# Patient Record
Sex: Male | Born: 1935 | Race: Black or African American | Hispanic: No | Marital: Married | Smoking: Former smoker
Health system: Southern US, Community
[De-identification: ages and names within clinical notes are randomized; demographics above are authoritative.]

## PROBLEM LIST (undated history)

## (undated) DIAGNOSIS — I351 Nonrheumatic aortic (valve) insufficiency: Secondary | ICD-10-CM

## (undated) DIAGNOSIS — I341 Nonrheumatic mitral (valve) prolapse: Secondary | ICD-10-CM

## (undated) DIAGNOSIS — E559 Vitamin D deficiency, unspecified: Secondary | ICD-10-CM

## (undated) DIAGNOSIS — K5790 Diverticulosis of intestine, part unspecified, without perforation or abscess without bleeding: Secondary | ICD-10-CM

## (undated) DIAGNOSIS — K2270 Barrett's esophagus without dysplasia: Secondary | ICD-10-CM

## (undated) DIAGNOSIS — R809 Proteinuria, unspecified: Secondary | ICD-10-CM

## (undated) DIAGNOSIS — I519 Heart disease, unspecified: Secondary | ICD-10-CM

## (undated) DIAGNOSIS — D369 Benign neoplasm, unspecified site: Secondary | ICD-10-CM

## (undated) DIAGNOSIS — E119 Type 2 diabetes mellitus without complications: Secondary | ICD-10-CM

## (undated) DIAGNOSIS — I10 Essential (primary) hypertension: Secondary | ICD-10-CM

## (undated) DIAGNOSIS — E785 Hyperlipidemia, unspecified: Secondary | ICD-10-CM

## (undated) DIAGNOSIS — K4090 Unilateral inguinal hernia, without obstruction or gangrene, not specified as recurrent: Secondary | ICD-10-CM

## (undated) DIAGNOSIS — I2510 Atherosclerotic heart disease of native coronary artery without angina pectoris: Secondary | ICD-10-CM

## (undated) DIAGNOSIS — I219 Acute myocardial infarction, unspecified: Secondary | ICD-10-CM

## (undated) DIAGNOSIS — M1990 Unspecified osteoarthritis, unspecified site: Secondary | ICD-10-CM

## (undated) DIAGNOSIS — I340 Nonrheumatic mitral (valve) insufficiency: Secondary | ICD-10-CM

## (undated) DIAGNOSIS — G4700 Insomnia, unspecified: Secondary | ICD-10-CM

## (undated) HISTORY — DX: Insomnia, unspecified: G47.00

## (undated) HISTORY — DX: Nonrheumatic mitral (valve) prolapse: I34.1

## (undated) HISTORY — DX: Essential (primary) hypertension: I10

## (undated) HISTORY — PX: HERNIA REPAIR: SHX51

## (undated) HISTORY — DX: Vitamin D deficiency, unspecified: E55.9

## (undated) HISTORY — DX: Proteinuria, unspecified: R80.9

## (undated) HISTORY — PX: CATARACT EXTRACTION: SUR2

## (undated) HISTORY — DX: Barrett's esophagus without dysplasia: K22.70

## (undated) HISTORY — PX: TONSILLECTOMY: SUR1361

## (undated) HISTORY — DX: Unspecified osteoarthritis, unspecified site: M19.90

## (undated) HISTORY — DX: Acute myocardial infarction, unspecified: I21.9

## (undated) HISTORY — DX: Heart disease, unspecified: I51.9

## (undated) HISTORY — PX: TIBIA FRACTURE SURGERY: SHX806

## (undated) HISTORY — DX: Diverticulosis of intestine, part unspecified, without perforation or abscess without bleeding: K57.90

## (undated) HISTORY — DX: Hyperlipidemia, unspecified: E78.5

## (undated) HISTORY — DX: Nonrheumatic aortic (valve) insufficiency: I35.1

## (undated) HISTORY — DX: Benign neoplasm, unspecified site: D36.9

## (undated) HISTORY — DX: Unilateral inguinal hernia, without obstruction or gangrene, not specified as recurrent: K40.90

## (undated) HISTORY — DX: Nonrheumatic mitral (valve) insufficiency: I34.0

## (undated) HISTORY — DX: Type 2 diabetes mellitus without complications: E11.9

## (undated) HISTORY — PX: APPENDECTOMY: SHX54

## (undated) HISTORY — PX: TOTAL KNEE ARTHROPLASTY: SHX125

## (undated) HISTORY — PX: EYE SURGERY: SHX253

## (undated) HISTORY — DX: Atherosclerotic heart disease of native coronary artery without angina pectoris: I25.10

---

## 1998-10-12 ENCOUNTER — Encounter: Payer: Self-pay | Admitting: Urology

## 1998-10-14 ENCOUNTER — Observation Stay (HOSPITAL_COMMUNITY): Admission: RE | Admit: 1998-10-14 | Discharge: 1998-10-15 | Payer: Self-pay | Admitting: Urology

## 2000-10-10 HISTORY — PX: PENILE PROSTHESIS IMPLANT: SHX240

## 2001-01-09 DIAGNOSIS — I219 Acute myocardial infarction, unspecified: Secondary | ICD-10-CM

## 2001-01-09 HISTORY — PX: CORONARY ARTERY BYPASS GRAFT: SHX141

## 2001-01-09 HISTORY — DX: Acute myocardial infarction, unspecified: I21.9

## 2001-12-25 ENCOUNTER — Encounter: Payer: Self-pay | Admitting: Emergency Medicine

## 2001-12-26 ENCOUNTER — Inpatient Hospital Stay (HOSPITAL_COMMUNITY): Admission: AD | Admit: 2001-12-26 | Discharge: 2001-12-30 | Payer: Self-pay | Admitting: Cardiology

## 2001-12-26 ENCOUNTER — Encounter: Payer: Self-pay | Admitting: Surgery

## 2001-12-27 ENCOUNTER — Encounter: Payer: Self-pay | Admitting: Surgery

## 2001-12-28 ENCOUNTER — Encounter: Payer: Self-pay | Admitting: Surgery

## 2002-01-21 ENCOUNTER — Encounter: Admission: RE | Admit: 2002-01-21 | Discharge: 2002-01-21 | Payer: Self-pay | Admitting: Surgery

## 2002-01-21 ENCOUNTER — Encounter: Payer: Self-pay | Admitting: Surgery

## 2002-01-27 ENCOUNTER — Encounter (HOSPITAL_COMMUNITY): Admission: RE | Admit: 2002-01-27 | Discharge: 2002-04-27 | Payer: Self-pay | Admitting: Cardiology

## 2002-02-18 ENCOUNTER — Encounter: Admission: RE | Admit: 2002-02-18 | Discharge: 2002-05-19 | Payer: Self-pay | Admitting: Surgery

## 2003-01-10 HISTORY — PX: CARDIAC SURGERY: SHX584

## 2003-03-02 ENCOUNTER — Ambulatory Visit (HOSPITAL_COMMUNITY): Admission: RE | Admit: 2003-03-02 | Discharge: 2003-03-02 | Payer: Self-pay | Admitting: Internal Medicine

## 2003-03-26 ENCOUNTER — Ambulatory Visit (HOSPITAL_COMMUNITY): Admission: RE | Admit: 2003-03-26 | Discharge: 2003-03-26 | Payer: Self-pay | Admitting: *Deleted

## 2003-05-28 ENCOUNTER — Ambulatory Visit (HOSPITAL_COMMUNITY): Admission: RE | Admit: 2003-05-28 | Discharge: 2003-05-28 | Payer: Self-pay | Admitting: *Deleted

## 2003-07-02 ENCOUNTER — Inpatient Hospital Stay (HOSPITAL_COMMUNITY): Admission: RE | Admit: 2003-07-02 | Discharge: 2003-07-08 | Payer: Self-pay | Admitting: Orthopedic Surgery

## 2003-07-22 ENCOUNTER — Emergency Department (HOSPITAL_COMMUNITY): Admission: EM | Admit: 2003-07-22 | Discharge: 2003-07-22 | Payer: Self-pay | Admitting: Family Medicine

## 2003-07-28 ENCOUNTER — Inpatient Hospital Stay (HOSPITAL_COMMUNITY): Admission: EM | Admit: 2003-07-28 | Discharge: 2003-07-31 | Payer: Self-pay | Admitting: Emergency Medicine

## 2005-08-24 ENCOUNTER — Encounter: Admission: RE | Admit: 2005-08-24 | Discharge: 2005-08-24 | Payer: Self-pay | Admitting: Internal Medicine

## 2005-09-13 ENCOUNTER — Encounter (INDEPENDENT_AMBULATORY_CARE_PROVIDER_SITE_OTHER): Payer: Self-pay | Admitting: Specialist

## 2005-09-13 ENCOUNTER — Ambulatory Visit (HOSPITAL_COMMUNITY): Admission: RE | Admit: 2005-09-13 | Discharge: 2005-09-13 | Payer: Self-pay | Admitting: *Deleted

## 2005-09-13 HISTORY — PX: ESOPHAGOGASTRODUODENOSCOPY: SHX1529

## 2005-11-04 ENCOUNTER — Emergency Department (HOSPITAL_COMMUNITY): Admission: EM | Admit: 2005-11-04 | Discharge: 2005-11-05 | Payer: Self-pay | Admitting: *Deleted

## 2007-05-28 ENCOUNTER — Encounter: Admission: RE | Admit: 2007-05-28 | Discharge: 2007-05-28 | Payer: Self-pay | Admitting: Internal Medicine

## 2007-06-04 ENCOUNTER — Ambulatory Visit (HOSPITAL_COMMUNITY): Admission: RE | Admit: 2007-06-04 | Discharge: 2007-06-04 | Payer: Self-pay | Admitting: *Deleted

## 2007-06-04 ENCOUNTER — Encounter (INDEPENDENT_AMBULATORY_CARE_PROVIDER_SITE_OTHER): Payer: Self-pay | Admitting: *Deleted

## 2007-07-31 ENCOUNTER — Encounter (INDEPENDENT_AMBULATORY_CARE_PROVIDER_SITE_OTHER): Payer: Self-pay | Admitting: *Deleted

## 2007-07-31 ENCOUNTER — Ambulatory Visit (HOSPITAL_COMMUNITY): Admission: RE | Admit: 2007-07-31 | Discharge: 2007-07-31 | Payer: Self-pay | Admitting: *Deleted

## 2008-08-06 ENCOUNTER — Ambulatory Visit (HOSPITAL_COMMUNITY): Admission: RE | Admit: 2008-08-06 | Discharge: 2008-08-06 | Payer: Self-pay | Admitting: *Deleted

## 2008-08-06 ENCOUNTER — Encounter (INDEPENDENT_AMBULATORY_CARE_PROVIDER_SITE_OTHER): Payer: Self-pay | Admitting: *Deleted

## 2009-01-09 DIAGNOSIS — D369 Benign neoplasm, unspecified site: Secondary | ICD-10-CM

## 2009-01-09 HISTORY — DX: Benign neoplasm, unspecified site: D36.9

## 2009-02-10 DIAGNOSIS — I340 Nonrheumatic mitral (valve) insufficiency: Secondary | ICD-10-CM

## 2009-02-10 HISTORY — DX: Nonrheumatic mitral (valve) insufficiency: I34.0

## 2009-06-28 ENCOUNTER — Ambulatory Visit: Admission: RE | Admit: 2009-06-28 | Discharge: 2009-06-28 | Payer: Self-pay | Admitting: Cardiovascular Disease

## 2009-09-29 ENCOUNTER — Encounter: Admission: RE | Admit: 2009-09-29 | Discharge: 2009-09-29 | Payer: Self-pay | Admitting: Nephrology

## 2010-01-22 ENCOUNTER — Emergency Department (HOSPITAL_COMMUNITY)
Admission: EM | Admit: 2010-01-22 | Discharge: 2010-01-22 | Payer: Self-pay | Source: Home / Self Care | Admitting: Emergency Medicine

## 2010-01-24 LAB — COMPREHENSIVE METABOLIC PANEL
ALT: 26 U/L (ref 0–53)
AST: 91 U/L — ABNORMAL HIGH (ref 0–37)
Albumin: 3.5 g/dL (ref 3.5–5.2)
Alkaline Phosphatase: 82 U/L (ref 39–117)
BUN: 14 mg/dL (ref 6–23)
CO2: 24 mEq/L (ref 19–32)
Calcium: 8.9 mg/dL (ref 8.4–10.5)
Chloride: 101 mEq/L (ref 96–112)
Creatinine, Ser: 1.09 mg/dL (ref 0.4–1.5)
GFR calc Af Amer: >60 mL/min (ref >60)
GFR calc non Af Amer: >60 mL/min (ref >60)
Glucose, Bld: 153 mg/dL — ABNORMAL HIGH (ref 70–99)
Potassium: 3.9 mEq/L (ref 3.5–5.1)
Sodium: 135 mEq/L (ref 135–145)
Total Bilirubin: 0.5 mg/dL (ref 0.3–1.2)
Total Protein: 6.8 g/dL (ref 6.0–8.3)

## 2010-01-24 LAB — DIFFERENTIAL
Basophils Absolute: 0 10*3/uL (ref 0.0–0.1)
Basophils Relative: 0 % (ref 0–1)
Eosinophils Absolute: 0.2 10*3/uL (ref 0.0–0.7)
Eosinophils Relative: 4 % (ref 0–5)
Lymphocytes Relative: 40 % (ref 12–46)
Lymphs Abs: 1.7 10*3/uL (ref 0.7–4.0)
Monocytes Absolute: 0.4 10*3/uL (ref 0.1–1.0)
Monocytes Relative: 9 % (ref 3–12)
Neutro Abs: 2 10*3/uL (ref 1.7–7.7)
Neutrophils Relative %: 47 % (ref 43–77)

## 2010-01-24 LAB — CBC
HCT: 38.7 % — ABNORMAL LOW (ref 39.0–52.0)
Hemoglobin: 12.6 g/dL — ABNORMAL LOW (ref 13.0–17.0)
MCH: 29.6 pg (ref 26.0–34.0)
MCHC: 32.6 g/dL (ref 30.0–36.0)
MCV: 90.8 fL (ref 78.0–100.0)
Platelets: 154 10*3/uL (ref 150–400)
RBC: 4.26 MIL/uL (ref 4.22–5.81)
RDW: 13.8 % (ref 11.5–15.5)
WBC: 4.3 10*3/uL (ref 4.0–10.5)

## 2010-01-24 LAB — LIPASE, BLOOD: Lipase: 27 U/L (ref 11–59)

## 2010-04-17 LAB — GLUCOSE, CAPILLARY
Glucose-Capillary: 94 mg/dL (ref 70–99)
Glucose-Capillary: 99 mg/dL (ref 70–99)

## 2010-05-24 NOTE — Op Note (Signed)
NAME:  Richard [name], Richard [name] NO.:  [id number]   MEDICAL RECORD NO.:  [id number]          PATIENT TYPE:  AMB   LOCATION:  ENDO                         FACILITY:  [facility]   PHYSICIAN:  Georgiana Spinner, M.D.    DATE OF BIRTH:  06-Mar-1931   DATE OF PROCEDURE:  06/04/2007  DATE OF DISCHARGE:                               OPERATIVE REPORT   PROCEDURE:  Upper endoscopy with Savary dilation and biopsy.   INDICATIONS:  Dysphagia.   ANESTHESIA:  Fentanyl 75 mcg and Versed 7.5 mg.   DESCRIPTION OF PROCEDURE:  With the patient mildly sedated in room 4 of  endoscopy at [facility], the Pentax videoscopic endoscope was  inserted in the mouth passed under direct vision rather easily into the  esophagus and distally which appeared normal until we reached the distal  esophagus and there appeared to be an esophageal stricture.  We were  able to pass through this with slight resistance into a hiatal hernia  which was photographed.  We advanced through the stomach, fundus, body,  antrum, duodenal bulb, second portion duodenum were visualized.  From  this point the endoscope was slowly withdrawn taking circumferential  views of duodenal mucosa until the endoscope had been pulled back in the  stomach, placed in retroflexion to view the stomach from below.  The  endoscope was then straightened and biopsies were taken of the  erythematous changes in the gastric antrum.  Once accomplished, a  guidewire was passed.  The endoscope was withdrawn.  Subsequently Savary  dilators 14 and 16 were passed rather easily over the guidewire with  fluoroscopic control.  There was no evidence of blood on either dilator,  but I elected to withdraw the guidewire with removal of the 16 Savary  dilator.  The endoscope was then reinserted and some small amount of  blood was suctioned from the hiatal hernia sac.  The endoscope was then  withdrawn.  The patient's vital signs and pulse oximeter remained  stable.  The patient tolerated the procedure well without apparent  complications.   FINDINGS:  1. Erythematous changes of antrum of stomach.  Biopsies taken to rule      out gastritis.  2. Question of Barrett's esophagus seen on retroflex view, visualized,      but not biopsied at this point.  3. Dilation of distal esophagus to 14 and 16 Savary.   PLAN:  Await clinical response and biopsy report.  The patient will call  me for results and follow-up with me as an outpatient.  We will make  sure the patient is started if not already on a PPI.           ______________________________  Georgiana Spinner, M.D.     GMO/MEDQ  D:  06/04/2007  T:  06/04/2007  Job:  [id number]

## 2010-05-24 NOTE — Op Note (Signed)
NAME:  Richard [name], Richard [name] NO.:  [id number]   MEDICAL RECORD NO.:  [id number]          PATIENT TYPE:  AMB   LOCATION:  ENDO                         FACILITY:  [facility]   PHYSICIAN:  Georgiana Spinner, M.D.    DATE OF BIRTH:  1931/05/23   DATE OF PROCEDURE:  08/06/2008  DATE OF DISCHARGE:                               OPERATIVE REPORT   PROCEDURE:  Upper endoscopy with biopsy.   INDICATIONS:  GERD.   ANESTHESIA:  Fentanyl 75 mcg, Versed 6 mg.   PROCEDURE:  With the patient mildly sedated in the left lateral  decubitus position, the Pentax videoscopic endoscope was inserted in the  mouth, passed under direct vision through the esophagus which appeared  normal, and we entered into the stomach through a hiatal hernia.  Fundus, body, antrum, duodenal bulb, and second portion of the duodenum  were visualized.  From this point, the endoscope was slowly withdrawn,  taking circumferential views of duodenal mucosa until the endoscope been  pulled back in the stomach and placed in retroflexion to view the  stomach from below, and on close view, there appeared to be an area of  Barrett's esophagus, photographed and biopsies taken.  The endoscope was  straightened and withdrawn, taking circumferential views of the  remaining gastric and esophageal mucosa.  The patient's vital signs and  pulse oximeter remained stable.  The patient tolerated the procedure  well without apparent complications.   FINDINGS:  What appears to be Barrett's esophagus seen on retroflexed  view.  Await biopsy report.  The patient will call me for results and  follow-up as needed as an outpatient.           ______________________________  Georgiana Spinner, M.D.     GMO/MEDQ  D:  08/06/2008  T:  08/06/2008  Job:  [id number]

## 2010-05-24 NOTE — Op Note (Signed)
NAME:  [name], Richard NO.:  [id number]   MEDICAL RECORD NO.:  [id number]          PATIENT TYPE:  AMB   LOCATION:  ENDO                         FACILITY:  [facility]   PHYSICIAN:  Georgiana Spinner, M.D.    DATE OF BIRTH:  09/04/1931   DATE OF PROCEDURE:  DATE OF DISCHARGE:                               OPERATIVE REPORT   PROCEDURE:  Upper endoscopy with biopsy.   INDICATIONS:  Question of Barrett's esophagus.   ANESTHESIA:  Fentanyl 100 mcg, Versed 10 mg.   PROCEDURE IN DETAIL:  With the patient mildly sedated in the left  lateral decubitus position, the Pentax videoscopic endoscope was  inserted in the mouth and passed under direct vision through the  esophagus, which appeared normal.  There was no evidence of Barrett's on  direct view.  At this point we entered into the stomach through a hiatal  hernia.  The fundus appeared to be somewhat erythematous.  Photographs  were taken and subsequent biopsies were taken as well.  The endoscope  was advanced through the stomach, into the duodenal bulb, second portion  duodenum, both of which appeared normal.  From this point, the endoscope  was slowly withdrawn, taking circumferential views of duodenal mucosa,  until the endoscope had been pulled back into stomach, placed in  retroflexion to view the stomach from below, and a hiatal hernia was  seen.  We entered into the hiatal hernia and viewed the GE junction from  below and once again on close inspection,  there appeared not to be any  evidence of Barrett's esophagus.  The endoscope was then straightened  and withdrawn, taking the circumferential views of the remaining gastric  and esophageal mucosa, biopsying the fundic area of erythema.  The  patient's vital signs, pulse oximeter remained stable.  The patient  tolerated procedure well, without apparent complication.   FINDINGS:  Erythematous stomach.  Await biopsy report.  Hiatal hernia,  otherwise an unremarkable exam  at this time.   PLAN:  The patient will call me for results and follow up with me as  needed as an outpatient           ______________________________  Georgiana Spinner, M.D.     GMO/MEDQ  D:  07/31/2007  T:  07/31/2007  Job:  [id number]

## 2010-05-27 NOTE — Discharge Summary (Signed)
NAME:  Richard, [name]                           ACCOUNT NO.:  [id number]   MEDICAL RECORD NO.:  [id number]                   PATIENT TYPE:  INP   LOCATION:  5020                                 FACILITY:  [facility]   PHYSICIAN:  Burnard Bunting, M.D.                 DATE OF BIRTH:  1931/11/19   DATE OF ADMISSION:  07/02/2003  DATE OF DISCHARGE:  07/08/2003                                 DISCHARGE SUMMARY   DISCHARGE DIAGNOSIS:  Left knee arthritis.   SECONDARY DIAGNOSES:  1. Coronary artery disease.  2. Diabetes.  3. Hypertension.  4. Increased cholesterol.   OPERATIONS/PROCEDURES:  Left total knee replacement, performed July 02, 2003.   HOSPITAL COURSE:  Richard [name] is a 77 year old patient with left knee  arthritis, status post tibial plating.  He underwent left total knee  arthroplasty on July 02, 2003.  He tolerated the procedure well without any  complications.  He was transferred to the recovery room in stable condition.  Motor and sensory function of the foot was intact at that time.  He was  started on Coumadin for DVT prophylaxis and CPM machine for motion.  PT was  started for weightbearing as tolerated.  The patient mobilized well with  physical therapy.  The incision was intact on post-op day #2.  He had an  otherwise uneventful recovery.  He was discharged home in good condition  with blood sugars well controlled on July 08, 2003.  Ultrasound was negative  for DVT, before discharge.   The patient will follow up with me in seven days for suture removal.   DISCHARGE MEDICATIONS:  Include Coumadin, Percocet plus preadmission  medications.                                                Burnard Bunting, M.D.    GSD/MEDQ  D:  08/04/2003  T:  08/04/2003  Job:  [id number]

## 2010-05-27 NOTE — Op Note (Signed)
NAME:  [name], Richard [name]                           ACCOUNT NO.:  [id number]   MEDICAL RECORD NO.:  [id number]                   PATIENT TYPE:  AMB   LOCATION:  ENDO                                 FACILITY:  [facility]   PHYSICIAN:  Georgiana Spinner, M.D.                 DATE OF BIRTH:  08-16-1931   DATE OF PROCEDURE:  03/26/2003  DATE OF DISCHARGE:  03/26/2003                                 OPERATIVE REPORT   PROCEDURE:  Upper endoscopy with Savary dilation.   INDICATIONS:  Dysphagia with stricture.   ANESTHESIA:  Demerol 100, Versed 7.5 mg.   DESCRIPTION OF PROCEDURE:  With patient mildly sedated in the left lateral  decubitus position, the Olympus videoscopic endoscope was inserted in the  mouth, passed under direct vision through the esophagus, which appeared  normal, except there was a question of Barrett's esophagus seen and  photographed.  We entered into the stomach.  Fundus, body, antrum, duodenal  bulb, second portion of duodenum all appeared normal.  From this point, the  endoscope was slowly withdrawn, taking circumferential views of the duodenal  mucosa until the endoscope pulled back into the stomach, placed in  retroflexion to view the stomach from below.  The endoscope was then  straightened, and a guidewire was passed.  The endoscope was withdrawn.  Subsequently, 15 and 18 Savary dilators were passed rather easily.  Over the  guidewire with the latter, there was a trace amount of blood.  The endoscope  was reinserted, and it was noted that there was a small amount of blood in  this area which precluded being to able to biopsy the tissue adequately.  Therefore, the endoscope was withdrawn.  The patient's vital signs and pulse  oximeter remained stable.  The patient tolerated the procedure well without  apparent complications.   FINDINGS:  Stricture, dilated to 15 and 18 Savary.  Await clinical response, and we will have the patient follow up with me as  an outpatient and  consider repeat examination in 1 year for Barrett's  evaluation.                                               Georgiana Spinner, M.D.    GMO/MEDQ  D:  03/26/2003  T:  03/28/2003  Job:  [id number]

## 2010-05-27 NOTE — H&P (Signed)
NAME:  Richard [name], Richard [name]                           ACCOUNT NO.:  [id number]   MEDICAL RECORD NO.:  [id number]                   PATIENT TYPE:  INP   LOCATION:  1823                                 FACILITY:  [facility]   PHYSICIAN:  Jonna L. Robb Matar, M.D.            DATE OF BIRTH:  07/18/1931   DATE OF ADMISSION:  07/28/2003  DATE OF DISCHARGE:                                HISTORY & PHYSICAL   PRIMARY CARE PHYSICIAN:  Dr. Dimas Alexandria.   CHIEF COMPLAINT:  Passed out.   HISTORY:  This 77 year old African-American male underwent left total knee  replacement on July 02, 2003.  Since then he has lost his appetite, things  do not taste right, he has been drinking diet ginger ale and water and  eating nothing but applesauce and mashed potatoes.  He has been very tired,  weak, does not feel like getting up.  His family was wondering if he might  be a little bit depressed, but he states it is just because he feels so  poorly.  He has lost 26 pounds since surgery and today he passed out in Dr.  Pincus Sanes office, so he was brought to the hospital.   PAST MEDICAL HISTORY:  1. 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  2. Hypertension.  3. Type 2 diabetes.  The patient has noticed that his sugars which were     usually better controlled, have been running anywhere from 130 to 200, he     has been thirstier than usual, he also noted since his surgery that he     has had some burning or stinging sensation with urination.   ALLERGIES:  NONE.   OPERATIONS:  1. Left total knee replacement.  2. CABG five vessel.   FAMILY HISTORY:  Hypertension, diabetes, CVA.   SOCIAL HISTORY:  Ex smoker, ex drinker, married, several children.  He  complains that he has had 12 people on his case telling him what to do.  He  is a [employer] doctor.   MEDICATIONS:  1. Coumadin 5 daily.  2. Glipizide 5 daily.  3. Oxycodone 5/325 which he has not been  taking since he has not been in     pan.  4. Robaxin 500 mg which he also has not been taking.  5. Temazepam 15 at h.s.  6. Niaspan 500 at h.s.  7. Metformin 500 b.i.d.  8. Coreg 12.5 b.i.d.  9. Lisinopril/HCTZ 20/12.5 daily.   REVIEW OF SYSTEMS:  The patient had some generalized weakness, but no fever,  no visual problems.  He says his mouth has felt very dry and his throat has  been a little sore.  No coughing, wheezing, shortness of breath.  No chest  pains or swelling of his legs.  He has not actually been nauseous or  throwing  up.  He has had no hematemesis or melena, no previous history of  ulcer disease or liver problems.  He denies any hematuria or renal calculi.  No prostate problems.  He denies thyroid problems.  No skin disease.  Some  mild arthritis, but he has not been taking any anti-inflammatories over the  counter.  No history of CVA.  He denies depression, but as noted his family  wonders about his lack of motivation to get out of bed.   PHYSICAL EXAMINATION:  VITAL SIGNS:  Blood pressure was 80 on palp on EMS  and is 89/53 now, pulse 66, respirations 19, 97% on O2 on room air, 98.4.  GENERAL:  Well-developed, older African-American male in no acute distress.  HEENT:  Conjunctivae and lids are normal.  Pupils are reactive to light.  EOMs are full.  He has normal hearing.  His mucosa is dry and his tongue has  a little bit of a white coating on it.  NECK:  No masses, thyromegaly or carotid bruits.  RESPIRATORY:  Effort is normal, the lungs are clear to A&P with no wheezing,  rales, rhonchi or dullness.  HEART:  Regular rate and rhythm, normal S1 and S2 without murmurs, rubs or  gallops.  There is no cyanosis, clubbing or edema.  Sternotomy scar.  ABDOMEN:  Nontender, normal bowel sounds, no hepatosplenomegaly or hernias.  GU:  Normal scrotum and penis.  Prostate is slightly enlarged, nontender.  No cervical or inguinal adenopathy.  EXTREMITIES:  Muscle strength is  5/5 in all four extremities.  He has fairly  good range of motion of the left knee, there is a bit of swelling, but it  looks like it has healed up well.  SKIN:  No skin lesions or nodules.  NEUROLOGIC:  Cranial nerves are intact.  DTRs are 2+ at the knees, there is  no loss of sensation in the feet.  The patient is alert and oriented times  three.  Normal memory, judgment and affect.   INITIAL LABORATORY DATA:  Cardiac enzymes are negative.  White count is 8.9,  hemoglobin 11.0 with normal indices.  INR 3.1.  Sodium is mildly low at 132,  but of note a BUN 93, creatinine 3.8, glucose 174.  LFTs and calcium are  normal.  EKG shows poor R wave progression suggestive of an old anterior  wall MI.   History was obtained both from the patient and his wife and daughter.   IMPRESSION:  1. Syncope secondary to dehydration.  The patient will be put on intravenous     fluids and be taken off hydrochlorothiazide.  This also may actually     represent an acute renal failure and I will ultrasound his kidneys and     get a longer term evaluation.  Some of this may just be coming from     poorly controlled diabetes.  2. Anorexia.  This could be coming from ulcer disease, depression,     dehydration, poorly controlled diabetes, vitamin deficiencies like zinc.     We will work up all of these issues.  3. Type 2 diabetes.  Pretty poor control at this point, certainly with a     creatinine of 3.8 he should not be on metformin. I would increase his     Glucotrol to XL 10 daily and put him on sliding scale.  4. Coronary artery disease and hypertension.  I will continue his Coreg, but     I am going to  hold his ACE inhibitor.  5. Hypercholesterolemia.  We will continue Niaspan.  6. Carotid artery stenosis by history from his old records.  There is no     complaint of neurologic deficits, I will just note this.  7. Anemia.  This is probably mild blood loss with his surgery a month ago,    but I will work  it up nonetheless.                                                Jonna L. Robb Matar, M.D.    Dorna Bloom  D:  07/28/2003  T:  07/28/2003  Job:  [id number]   cc:   Janae Bridgeman. Eloise Harman., M.D.  [address]  [place]  [place] [address]  Fax: [phone]

## 2010-05-27 NOTE — Op Note (Signed)
NAME:  Richard [name], Richard [name] NO.:  [id number]   MEDICAL RECORD NO.:  [id number]                   PATIENT TYPE:  INP   LOCATION:  2012                                 FACILITY:  [facility]   PHYSICIAN:  Evelene Croon, M.D.                  DATE OF BIRTH:  1931/03/24   DATE OF PROCEDURE:  12/26/2001  DATE OF DISCHARGE:                                 OPERATIVE REPORT   PREOPERATIVE DIAGNOSIS:  Severe three-vessel coronary artery disease, status  post acute myocardial infarction.   POSTOPERATIVE DIAGNOSIS:  Severe three-vessel coronary artery disease,  status post acute myocardial infarction.   PROCEDURES:  1. Median sternotomy.  2. Extracorporeal circulation.  3. Coronary artery bypass graft surgery x5 using a left internal mammary     artery graft to the left anterior descending coronary artery, with a     saphenous vein graft to the obtuse marginal branch of the left circumflex     coronary artery, a sequential saphenous vein graft to two branches of the     intermediate coronary artery, and a saphenous vein graft to the right     coronary artery.  4. Endoscopic vein harvesting from the right leg.   SURGEON:  Evelene Croon, M.D.   ASSISTANT:  Loura Pardon, P.A.-C.   ANESTHESIA:  General endotracheal.   CLINICAL HISTORY:  This patient is a 77 year old black male with a history  of diabetes, smoking, and obesity, who was admitted with unstable angina and  a non-Q-wave myocardial infarction.  A cardiac catheterization showed severe  three-vessel coronary artery disease with a 90% LAD stenosis.  There was a  99% hazy proximal intermediate stenosis involving the bifurcation into two  large branches.  The right coronary artery was a small, nondominant vessel  that had about 80% midvessel stenosis.  The left circumflex was a dominant  vessel that gave off three small branches.  Between the first and second  marginal branches there was about 80% stenosis.  Left  ventricular function  was well-reserved.  After review of the angiogram and examination of the  patient, it was felt that coronary artery bypass graft surgery was the best  treatment.  I discussed the operative procedure with the patient and his  wife and daughter, including alternatives, benefits, and risks, including  bleeding, blood transfusion, infection, stroke, myocardial infarction, and  death.  They understood and agreed to proceed.   DESCRIPTION OF PROCEDURE:  The patient was taken to the operating room and  placed on the table in the supine position.  After induction of general  endotracheal anesthesia, a Foley catheter was placed in the bladder using  sterile technique.  Then the chest, abdomen, and both lower extremities were  prepped and draped in the usual sterile manner.  The chest was entered  through a median sternotomy incision and the pericardium opened in the  midline.  Examination of the heart showed good ventricular contractility.  The ascending aorta had no palpable plaques in it.   Then the left internal mammary artery was harvested from the chest wall as a  pedicle graft.  This was a medium-caliber vessel with excellent blood flow  through it.  At the same time a segment of greater saphenous vein was  harvested from the right leg using endoscopic vein harvest technique.  This  vein was of medium size and good quality.   Then the patient was heparinized and when an adequate activated clotting  time was achieved, the distal ascending aorta was cannulated using a 20  French aortic cannula for arterial inflow.  Venous outflow was achieved  using a two-stage venous cannula through the right atrial appendage.  An  antegrade cardioplegia and vent cannula was inserted in the aortic root.   The patient was placed on cardiopulmonary bypass and the distal coronary  arteries identified.  The LAD was a large, graftable vessel that was  diffusely diseased.  There were  several small diagonal branches that were  nongraftable vessels.  The intermediate gave off two large branches, both of  which were intramyocardial.  These were localized in the midportion, where  they appeared relatively free of disease.  The first obtuse marginal was a  tiny vessel.  The second marginal was also very small but felt to be  borderline graftable.  The distal left circumflex terminated as a small  posterior descending branch that was to small to graft.  The right coronary  artery was a nondominant vessel that crossed over the acute margin of the  heart and was suitable for grafting.   Then the aorta was crossclamped and 500 cc of cold blood antegrade  cardioplegia was administered in the aortic root with quick arrest of the  heart.  Systemic hypothermia to 20 degrees Centigrade and topical  hypothermia with iced saline was used.  A temperature probe was placed in  the septum and an insulating pad in the pericardium.   The first distal anastomosis was performed to the second obtuse marginal  branch.  The internal diameter of this vessel was about 1.25 mm.  The  conduit used was a segment of greater saphenous vein and the anastomosis  performed in an end-to-side manner using continuous 8-0 Prolene suture.  Flow was measured through the graft and was good.  Then another dose of  cardioplegia was given down this vein graft.   The second distal anastomosis was preformed to the first branch of the  intermediate coronary artery.  The internal diameter was about 2.5 mm.  The  conduit used was a second segment of greater saphenous vein and the  anastomosis performed in a sequential Steri-Strips manner using continuous 7-  0 Prolene suture.  Flow was measured through the graft and was excellent.   The third distal anastomosis was performed to the second branch of the  intermediate coronary artery.  The internal diameter was also about 2.5 mm. The conduit used was the same segment  of greater saphenous vein and the  anastomosis performed in a sequential end-to-side manner using continuous 7-  0 Prolene suture.  Flow was measured through the graft and was excellent.  Then another dose of cardioplegia was given down this vein graft and in the  aortic root.   The fourth distal anastomosis was performed to the right coronary artery.  The internal diameter of this vessel was about 1.75 mm.  The conduit used  was a third segment of greater saphenous vein and the anastomosis performed  in an end-to-side manner using continuous 7-0 Prolene suture.  Flow was  measured through the graft and was excellent.   The fifth distal anastomosis was performed to the mid- to distal portion of  the left anterior descending coronary artery.  The internal diameter was  about 2 mm.  The conduit used was the left internal mammary graft, and this  was brought through an opening in the left pericardium anterior to the  phrenic nerve.  It was anastomosed to the LAD in an end-to-side manner using  continuous 8-0 Prolene suture.  The pedicle was tacked to the epicardium  with 6-0 Prolene sutures.  The patient was rewarmed to 37 degrees Centigrade  and the clamp removed from the mammary pedicle.  There was rapid warming of  the ventricular septum and the return of spontaneous ventricular  fibrillation.  The crossclamp was removed with a time of 85 minutes, and the  patient defibrillated into sinus rhythm.   A partial occlusion clamp was placed on the aortic root and the three  proximal vein graft anastomoses were performed in an end-to-side manner  using continuous 6-0 Prolene suture.  The clamp was removed, the vein grafts  de-aired, and the clamps removed from them.  The proximal and distal  anastomoses appeared hemostatic and the alignment of the grafts  satisfactory.  Graft markers were placed around the proximal anastomoses.  Two temporary right ventricular and right atrial pacing wires  were placed  and brought out through the skin.   When the patient had rewarmed to 37 degrees Centigrade, he was weaned from  cardiopulmonary bypass on no inotropic agents.  Total bypass time was 124  minutes.  Cardiac function appeared excellent with a cardiac output of 5  L/min.  Protamine was given, and the venous and aortic cannulas were removed  without difficulty.  Hemostasis was achieved.  Three chest tubes were placed  with two in the posterior pericardium, one in the left pleural space and one  in the anterior mediastinum.  The pericardium was reapproximated over the  heart.  The sternum was closed with #6 stainless steel wires.  The fascia  was closed with continuous 2-0 Vicryl and the skin with 3-0 Vicryl  subcuticular closure.  The lower extremity vein harvest site was closed in  layers in a similar manner.  The sponge, needle, and instrument counts were  correct according to the scrub nurse.  Dry sterile dressings were applied over the incisions and around the chest tubes, which were hooked to  Pleuravac suction.  The patient remained hemodynamically stable and was  transported to the SICU in guarded but stable condition.                                               Evelene Croon, M.D.    BB/MEDQ  D:  12/26/2001  T:  12/28/2001  Job:  [id number]   cc:   Cristy Hilts. Jacinto Halim, M.D.  [address]  [place]  [place] [address]  Fax: [phone]   [facility]

## 2010-05-27 NOTE — Discharge Summary (Signed)
NAME:  BRANDAN, ROBICHEAUX                           ACCOUNT NO.:  [id number]   MEDICAL RECORD NO.:  [id number]                   PATIENT TYPE:  INP   LOCATION:  5153                                 FACILITY:  [facility]   PHYSICIAN:  Elliot Cousin, M.D.                 DATE OF BIRTH:  07/06/1931   DATE OF ADMISSION:  07/28/2003  DATE OF DISCHARGE:  07/31/2003                                 DISCHARGE SUMMARY   __________.   DISCHARGE DIAGNOSES:  1. Syncope versus presyncope felt to be secondary to volume depletion/acute     renal insufficiency.  2. Acute renal insufficiency.  3. E coli urinary tract infection.  4. Elevated prostate-specific antigen.  5. Status post left knee arthroplasty  6. Type was diabetes mellitus.  7. History of five-vessel coronary artery bypass grafting in the past,     status post acute myocardial infarction in December 2003.   DISCHARGE MEDICATIONS:  1. Do not take Coumadin.  2. Do not take lisinopril/hydrochlorothiazide until you see Dr. Lendell Caprice.  3. Cipro 250 mg b.i.d. for 10 days.  4. Glipizide 5 mg daily.  5. Metformin 500 mg one p.o. twice daily.  6. Temazepam 15 mg at night p.r.n.  7. Coreg 12.5 mg b.i.d.  8. Niaspan 500 mg at night.   DISPOSITION:  The patient was discharged to home in improved and stable  condition on July 31, 2003.  He has a followup appointment with his primary  care physician, Dr. Lendell Caprice, August 10, 2003 at 11:15 a.m.   CONSULTATIONS:  Dr. Lajoyce Corners.   PROCEDURES PERFORMED:  Renal ultrasound on July 29, 2003.  The results  revealed negative for hydronephrosis.  Right kidney measures 11.8 cm and the  left measures 11.9 cm.   HISTORY OF PRESENT ILLNESS:  The patient is a 77 year old African American  man with past medical history significant type 2 diabetes mellitus,  hypertension, and total left knee replacement on July 02, 2003 who presented  to Dr. Pincus Sanes office on July 28, 2003.  The patient had a loss of  appetite, things  did not taste right, and he felt very tired and weak.  He  had lost approximately 26 pounds since has last knee replacement in July 02, 2003.  When the patient was in Dr. Pincus Sanes office on July 28, 2003, he  apparently passed out.  The patient was, therefore, brought to the hospital  for further evaluation and management.   HOSPITAL COURSE:  Problem #1.  Acute renal failure, E coli urinary tract  infection, volume depletion.  The patient's chemistry panel on admission was  impressive for a BUN of 93 and a creatinine of 3.8.  His white blood cell  count was within normal limits at 8.9 and his INR was 3.1 for Coumadin  following the left knee replacement.  On review of the patient's lab data  from July 04, 2003,  his BUN was 14 and his creatinine was 1.3.  For further  evaluation of the patient's obvious acute renal failure, a urinalysis was  ordered as well as a renal ultrasound.  It was noted that the patient had  been treated in the outpatient setting with lisinopril/hydrochlorothiazide  for his history of hypertension.  Given the renal findings, the  lisinopril/hydrochlorothiazide were discontinued during the hospital course.  The patient was started on IV fluids with normal saline.  His renal function  was followed closely daily.  The patient's urinalysis revealed a large  amount of leukocyte esterase, many bacteria, and 21-50 wbc's.  The urine  culture eventually grew out E coli, greater than 100,000 colonies.  The  patient was started first on Rocephin 1 g IV daily empirically until the  culture results became apparent.  The Rocephin was eventually discontinued  and the patient was started on intravenous Cipro after the culture grew out  E coli which was sensitive to Cipro.  The patient's renal ultrasound  revealed no evidence of hydronephrosis.   The patient's BUN responded quite well to the intravenous fluids.  The  following day the patient's BUN fell to 61 and the creatinine  had improved  to 2.2.  On hospital day #3, the BUN improved to 33 and the creatinine  improved to 1.7.  The patient's generalized weakness had completely  resolved.  He felt stronger.  He began to ambulate in the room.  He had no  signs or symptoms consistent with a neurological cause for his syncope.  The  presyncope versus syncope was probably secondary to volume depletion and the  urinary tract infection.  This apparently all lead to the patient's acute  renal failure.  At the time of hospital discharge, the patient was advised  to not restart the hydrochlorothiazide and lisinopril until he was  reassessed by his primary care physician, Dr. Lendell Caprice in one week.  The  patient was advised to become liberal with his fluid intake over the next  seven days following hospital discharge.  He was also discharged to home on  Cipro 250 mg b.i.d. for an additional 10 days.  It was noted that the  patient's PSA was elevated at 7.82.  The elevation in the patient's PSA  could be attributable to the urinary tract infection.  However, the patient  may need an outpatient urology followup.  This decision will be deferred to  his primary care physician, Dr. Lendell Caprice.   Problem #2.  Total left knee arthroplasty on July 02, 2003 per Dr. Lajoyce Corners.  Dr. Lajoyce Corners was consulted for evaluation of the patient's left knee.  Per Dr.  Audrie Lia assessment, the patient's left knee incision was healing very well.  He recommended increasing the range of motion of the right knee per PT while  in the hospital and in the outpatient setting when the patient is at home.  Dr. Lajoyce Corners also recommended discontinuing the Coumadin which had been started  following the left knee operation in July 02, 2003.  The Coumadin was,  therefore, discontinued.   Problem #3.  Type 2 diabetes mellitus.  The patient's capillary blood sugars were well controlled during the hospital course.  The metformin was withheld  during the hospital course  secondary to acute renal insufficiency.  The  patient was maintained on glipizide 5 mg daily.  His hemoglobin A1C was 6.8  during the hospital course.   Problem #4.  Normocytic anemia.  The patient's hemoglobin during the  hospital course was 11 with hematocrit of 33 and MCV of 91.  Iron studies  revealed a total iron of 41, TIBC of 228, percent saturation of 18, vitamin  B12 __________ and serum folate of 9.  The patient was started on Nu-Iron at  150 mg daily.  He was discharged to home on a multivitamin with iron one  daily.   DISCHARGE LABORATORY DATA:  INR 1.8, sodium 141, potassium 4.2, chloride  114, CO2 22, glucose 101, BUN 33, creatinine 1.7, calcium 8.7, total  cholesterol 138, triglycerides 111, HDL 28, LDL 88, TSH 0.852, T4 6.8.                                                Elliot Cousin, M.D.    DF/MEDQ  D:  09/06/2003  T:  09/07/2003  Job:  [id number]

## 2010-05-27 NOTE — Cardiovascular Report (Signed)
NAME:  Richard [name], SCHLABACH NO.:  [id number]   MEDICAL RECORD NO.:  [id number]                   PATIENT TYPE:  OIB   LOCATION:  2930                                 FACILITY:  [facility]   PHYSICIAN:  Cristy Hilts. Jacinto Halim, M.D.                  DATE OF BIRTH:  Jan 22, 1931   DATE OF PROCEDURE:  12/25/2001  DATE OF DISCHARGE:                              CARDIAC CATHETERIZATION   PROCEDURE PERFORMED:  1. Left ventriculography.  2. Selective right and left coronary arteriography.  3. Intracoronary nitroglycerin administration.  4. Left subclavian arteriography including visualization of left internal     mammary artery.   INDICATIONS FOR PROCEDURE:  The patient is a 77 year old gentleman with a  history of hypertension, hypercholesterolemia, diabetes mellitus, who was  admitted through [facility] with chest pain suggestive of unstable  angina.  He also had ST segment elevation in the emergency room with chest  pain; hence, he was emergently transferred to [facility].  He was  found to have ST segment elevation in lead 1 and aVL with T wave inversion  in the inferior leads and anterior leads.   HEMODYNAMIC DATA:  1. The left ventricular pressures were 113/2 with an end diastolic pressure     of 22 mmHg.  2. The aortic pressures were 113/68 with a mean of 82 mmHg.  3. There was no pressure gradient across the aortic valve.   ANGIOGRAPHIC DATA:  1. Right coronary artery:  The right coronary artery is a nondominant     vessel.  It has a mid 80% stenosis.  There is a large conus branch that     has a separate ostia.  2. Left main coronary artery:  The left main coronary artery is a large     caliber vessel and a long vessel.  It is normal.  3. Circumflex coronary artery:  The circumflex artery is a large caliber     vessel.  It is a dominant vessel.  It gives origin to a high obtuse     marginal #1 which has a 99% AV thrombus lesion.  This obtuse  marginal #1     is a very large branch.  It supplies most of the lateral branch.  The     circumflex itself has in the mid to distal segment before the bifurcation     of obtuse marginal #2 and PDA has a 70-80% stenosis.  4. Left anterior descending artery:  The left anterior descending artery is     a severely diffuse vessel.  Right from the ostium up to the mid vessel,     it has an 85-90% long segmental stenosis.  In the mid to distal segment,     it has a 60% stenosis and the distal segment has about 80% stenosis.  It     wraps around the  apex.  5. Left ventricle:  The left ventricular systolic function was preserved     with an EF estimated in the lower limit of normal and was estimated at     50%.  There was a mid to distal anterolateral periapical and inferoapical     moderate hypokinesis.  There was a hypokinetic base.  6. Left subclavian artery:  The left subclavian artery and left internal     mammary artery were widely patent.   IMPRESSION:  1. Nondominant right coronary artery.  2. A dominant circumflex coronary artery with a hazy 99% stenosis with a     large obtuse marginal #1 which is a culprit lesion.  The circumflex also     has obtuse marginal #2, posterior descending artery bifurcation 70-80%     stenosis.  The circumflex is a dominant vessel.  3. Nonsegmental left anterior descending artery 85-90% stenosis in the     proximal and mid segment, and 60% in the mid to distal, and 80% stenosis     in the distal left anterior descending artery.  4. Left ventricular systolic function in the lower limit of normal and is     estimated at 50%.   RECOMMENDATIONS:  Based on his coronary anatomy as patient is presently  chest pain-free and his ECG is back to baseline with no evidence of  ischemia, the patient will be referred for possible coronary artery bypass  grafting.  The patient has been started on IV Integrilin.  Surgical  consultation has been done.   TECHNIQUE OF  PROCEDURE:  Under usual sterile precautions using #6 French  right femoral arterial access, a #6 [place] Multipurpose B2 catheter was  advanced into the ascending aorta over a 0.035-inch J wire.  The catheter  was gently advanced to the left ventricle.  Left ventricular pressures were  monitored.  Hand contrast injection of the left ventricle was performed both  in the LAO and RAO projection.  The catheter was flushed with saline and  pulled back into the ascending aorta and pressure gradient across the aortic  valve was monitored.  The right coronary artery was selectively engaged and  angiography was performed.  In a similar fashion, the left main coronary  artery was selectively engaged and angiography was performed.  Intracoronary  nitroglycerin was also administered.  Then the catheter was pulled back into  the arch of the aorta.  Left subclavian artery was selectively cannulated  and angiography was performed.  The catheter was then pulled out of the body  in the usual fashion and a #6 [place] diagnostic Judkins left was readvanced,  and the left main coronary artery was selectively engaged in the usual  fashion and arteriography was performed.  Then the catheter was pulled out  of the body in the usual fashion.  The patient tolerated the procedure well.                                               Cristy Hilts. Jacinto Halim, M.D.    Pilar Plate  D:  12/25/2001  T:  12/25/2001  Job:  [id number]   cc:   Janae Bridgeman. Eloise Harman., M.D.  [address]  [place]  [place] [address]  Fax: [phone]   [facility]

## 2010-05-27 NOTE — Op Note (Signed)
NAME:  Richard [name], Richard [name] NO.:  [id number]   MEDICAL RECORD NO.:  [id number]          PATIENT TYPE:  AMB   LOCATION:  ENDO                         FACILITY:  [facility]   PHYSICIAN:  Georgiana Spinner, M.D.    DATE OF BIRTH:  07/23/1931   DATE OF PROCEDURE:  DATE OF DISCHARGE:                                 OPERATIVE REPORT   PROCEDURE:  Upper endoscopy with dilation and biopsy.   INDICATIONS:  Dysphagia.   ANESTHESIA:  Fentanyl 100 mcg, Versed 8 mg.   PROCEDURE:  With the patient mildly sedated in the left lateral decubitus  position, the Olympus videoscopic endoscope was inserted in the mouth and  passed under direct vision through the esophagus, which appeared normal on  direct view.  We entered into the stomach through a hiatal hernia.  Fundus  body, antrum, duodenal bulb, second portion of duodenum were visualized.  From this point the endoscope was slowly withdrawn taking circumferential  views of the duodenum and mucosa until the endoscope was then pulled back  into the stomach, placed in retroflexion and viewed the stomach from below.  This was photographed at this time.  The endoscope was then straightened and  a guide wire was passed.  Subsequently, Savary dilator 17 mm was passed  rather easily with minimal resistance.  There was no blood on the dilator  and we withdrew the guide wire with the dilator.  The endoscope was  reinserted.  A small amount of blood was seen in the distal esophagus, as is  to be expected.  We advanced then further into the stomach, placed the  endoscope in retroflexion once again to view the stomach from below.  Biopsies were taken of the squamocolumnar junction to rule out Barrett's  esophagus.  The endoscope was then straightened and withdrawn.  The  patient's vital signs, pulse oximeter remained stable.  The patient  tolerated the procedure well with no apparent complications.   FINDINGS:  Changes of Barrett's esophagus, biopsied,  dilation of distal  esophagus.  Await biopsy report.   The patient will call me for results and follow up with me for clinical  results.           ______________________________  Georgiana Spinner, M.D.     GMO/MEDQ  D:  09/13/2005  T:  09/13/2005  Job:  [id number]

## 2010-05-27 NOTE — Discharge Summary (Signed)
NAME:  Richard [name], Richard [name] NO.:  [id number]   MEDICAL RECORD NO.:  [id number]                   PATIENT TYPE:  INP   LOCATION:  2012                                 FACILITY:  [facility]   PHYSICIAN:  Levin Erp. Steward, P.A.                DATE OF BIRTH:  Jul 03, 1931   DATE OF ADMISSION:  12/25/2001  DATE OF DISCHARGE:  12/30/2001                                 DISCHARGE SUMMARY   ADMISSION DIAGNOSIS:  Acute myocardial infarction.   SECONDARY DIAGNOSIS:  Postoperative anemia secondary to blood loss.   DISCHARGE DIAGNOSIS:  Coronary artery disease.   HOSPITAL COURSE:  The patient was admitted to [facility] on December 25, 2001,  after suffering an acute myocardial  infarction.  Because of this he underwent a cardiac catheterization which  revealed significant coronary artery disease.  Dr. Evelene Croon consulted.  On December 26, 2001, Dr. Laneta Simmers performed a coronary artery bypass graft  surgery x5 with the internal mammary anastomosed to the posterior descending  coronary artery, a sequential saphenous vein graft to the first and second  intermediate arteries, saphenous vein graft to the obtuse marginal artery,  and a saphenous vein graft to the right coronary artery.  No complications  were noted during the procedure.  Postoperatively the patient had postoperative anemia, secondary to blood  loss, which he tolerated well.  No transfusion was required.  His hemoglobin  and hematocrit stabilized at 11.4 and 33.5 respectively.  BUN and creatinine  were 10 and 1.   DISPOSITION:  He was subsequently discharged home on December 30, 2001, on  postoperative day number four after a relatively uneventful postoperative  course.   DISCHARGE MEDICATIONS:  1. Aspirin 325 mg one q.d.  2. Desyrel 25 mg q.d.  3. Zocor 80 mg q.d.  4. Lopressor 50 mg, 1/2 tab q.12h.  5. Lasix 40 mg, one q.d. for seven days.  6. Potassium chloride 20 mEq one q.d. for  seven days.  7. Ultram 50 mg one to two tab p.r.n. pain.   INSTRUCTIONS:  The patient instructed no driving or strenuous activity.  No  lifting of heavy objects.   WOUND CARE:  The patient will clean the incisions with soap and water.   DIET:  Low fat, low salt.    FOLLOW UP:  The patient was told to see Dr. Laneta Simmers at the [facility] in  three weeks.  Will call and verify the time and date of the appointment.  He  was told to call his cardiologist, Dr. Cristy Hilts. [place], for a two-week  appointment.  Nat Math, P.A.    BGS/MEDQ  D:  12/29/2001  T:  12/30/2001  Job:  [id number]

## 2010-05-27 NOTE — Op Note (Signed)
NAME:  Richard [name], Richard [name]                           ACCOUNT NO.:  [id number]   MEDICAL RECORD NO.:  [id number]                   PATIENT TYPE:  AMB   LOCATION:  ENDO                                 FACILITY:  [facility]   PHYSICIAN:  Georgiana Spinner, M.D.                 DATE OF BIRTH:  [birth year]   DATE OF PROCEDURE:  05/28/2003  DATE OF DISCHARGE:                                 OPERATIVE REPORT   PROCEDURE PERFORMED:  Colonoscopy.   ENDOSCOPIST:  Georgiana Spinner, M.D.   INDICATIONS FOR PROCEDURE:  Colon cancer screening.   ANESTHESIA:  Demerol 70 mg, Versed 7 mg.   DESCRIPTION OF PROCEDURE:  With the patient mildly sedated in the left  lateral decubitus position, the Olympus video colonoscope was inserted in  the rectum and passed under direct vision to the cecum, identified by the  base of cecum and ileocecal valve, both of which were photographed.  From  this point the colonoscope was slowly withdrawn taking circumferential views  of the colonic mucosa stopping to photograph diverticula seen in the sigmoid  colon until we reached the rectum, which appeared normal on direct and  showed hemorrhoids on retroflex view.  The endoscope was straightened and  withdrawn through the anal canal.  The patient's vital signs and pulse  oximeter remained stable.  The patient tolerated the procedure well without  apparent complications.   FINDINGS:  Diverticulosis of sigmoid colon.  Internal hemorrhoids.  Otherwise unremarkable examination.   PLAN:  Consider repeat examination in five to 10 years.                                               Georgiana Spinner, M.D.    GMO/MEDQ  D:  05/28/2003  T:  05/29/2003  Job:  [id number]

## 2010-05-27 NOTE — Op Note (Signed)
NAME:  Richard [name], Richard [name]                           ACCOUNT NO.:  [id number]   MEDICAL RECORD NO.:  [id number]                   PATIENT TYPE:  INP   LOCATION:  5020                                 FACILITY:  [facility]   PHYSICIAN:  Burnard Bunting, M.D.                 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   OPERATION PERFORMED:  Left total knee replacements.   SURGEON:  Burnard Bunting, M.D.   ASSISTANT:  Jerolyn Shin. Tresa Res, M.D.   ANESTHESIA:  General endotracheal.   ESTIMATED BLOOD LOSS:  100 mL.   DRAINS:  Hemovac times one.   DESCRIPTION OF PROCEDURE:  The patient was brought to the operating room  where general endotracheal anesthesia was induced.  Preoperative intravenous  antibiotics were administered.  The left leg and foot was prepped with  DuraPrep solution and draped in a sterile manner.  Operative field was  covered with Ioban.  The leg was elevated and exsanguinated with the Esmarch  wrap and the tourniquet was inflated.  Skin and subcutaneous tissue was  sharply divided in a longitudinal line anteriorly.  The median parapatellar  arthrotomy was made.  The precise location of this arthrotomy was marked  with a 0 Vicryl suture.  The anterior distal femur was cleared of soft  tissue.  The lateral patellofemoral ligament was released.  The fat pad was  partially excised.  The soft tissue was elevated from approximately 1.5 cm  of the tibial plateau but no more in order to preserve medial collateral  ligament integrity.  The popliteus and lateral collateral ligament were  partially released off of the femur.  At this time a distal femoral cut was  made in five degrees of valgus.   Intramedullary alignment was utilized and a  12 mm resection was made.  Next, the cutting jig was placed parallel to the  transepicondylar axis, size 9 was noted not to notch the femur.  The cutting  guide was then placed and the posterior anterior and chamfer cuts were made.  At this time extramedullary alignment was utilized on the tibia to subluxate  it forward with collateral ligaments well protected.  Using extramedullary  alignment and in accordance with preoperative templating the knee was cut so  that the tibial cut was parallel to the plafond in its valgus position.  This resulted in an expected varus cut.  The cut was based on a 2 mm  resection off the most affected lateral side.  Following the resection, a  box cut was made on the femur and trial components were placed.  Intraoperative x-ray  demonstrated a good alignment from the femur.  This was  confirmed visually.  The patient had good collateral ligament stability and  good patellar tracking with the 10 spacer in position.  At this time the  patella was prepared.  The patella measured 24 mm and a 10 mm resection was  made.  A size 7 patellar button was then placed in trial position.  The knee  was taken through a range of motion.  Again collateral ligament stability  was excellent and the rotation of the tibial component was marked on the  tibial.  The tibia was keel punched.  Trial components were removed.  Osteophytes were removed from the posteromedial and posterolateral femoral  condyles.  Soft tissue was stripped and the PCL stump was excised carefully.  At this time after irrigating the cut bony surfaces, the components were  cemented into position, with excess cement removed.  A __________ trial  spacer was placed.  After cement hardening had occurred, the knee was taken  through a range of motion and trialed with a 10 and 12 spacer.  The patient  had slight hyperextension with the 10 degree spacer.  Patellar tracking  was  better with a 10 degree spacer versus a 12 degree spacer.  The true  component was then placed  onto the dry tibial surface.  At this time knee  stability was excellent, range of motion was excellent. Patellar tracking  was also very good.  The tourniquet was released.  Bleeding points  encountered were controlled using electrocautery.  The knee was again  thoroughly irrigated and closed over a bolster using interrupted inverted #1  Vicryl suture.  Watertight closure over  a Hemovac drain was achieved.  Skin  was closed using interrupted inverted 2-0 Vicryl sutures followed by skin  staples.  Total tourniquet time was two hours and five minutes.  The patient  tolerated the procedure well without immediate complications.  He was placed  in a bulky dressing and knee immobilizer.                                               Burnard Bunting, M.D.    GSD/MEDQ  D:  07/03/2003  T:  07/05/2003  Job:  [id number]

## 2010-10-07 LAB — GLUCOSE, CAPILLARY: Glucose-Capillary: 136 — ABNORMAL HIGH

## 2011-03-10 HISTORY — PX: OTHER SURGICAL HISTORY: SHX169

## 2012-01-22 ENCOUNTER — Ambulatory Visit (INDEPENDENT_AMBULATORY_CARE_PROVIDER_SITE_OTHER): Payer: Medicare Other | Admitting: General Surgery

## 2012-02-06 ENCOUNTER — Encounter (INDEPENDENT_AMBULATORY_CARE_PROVIDER_SITE_OTHER): Payer: Self-pay | Admitting: General Surgery

## 2012-02-08 ENCOUNTER — Encounter (INDEPENDENT_AMBULATORY_CARE_PROVIDER_SITE_OTHER): Payer: Self-pay | Admitting: General Surgery

## 2012-02-08 ENCOUNTER — Ambulatory Visit (INDEPENDENT_AMBULATORY_CARE_PROVIDER_SITE_OTHER): Payer: Medicare Other | Admitting: General Surgery

## 2012-02-08 ENCOUNTER — Ambulatory Visit (INDEPENDENT_AMBULATORY_CARE_PROVIDER_SITE_OTHER): Payer: Self-pay | Admitting: Surgery

## 2012-02-08 VITALS — BP 122/62 | HR 72 | Temp 98.1°F | Resp 18 | Ht 70.0 in | Wt 178.8 lb

## 2012-02-08 DIAGNOSIS — K4090 Unilateral inguinal hernia, without obstruction or gangrene, not specified as recurrent: Secondary | ICD-10-CM | POA: Insufficient documentation

## 2012-02-08 NOTE — Patient Instructions (Signed)
You have a large right inguinal hernia.  You are advised and have agreed to have this repaired before you develop a complication.  We will ask your cardiologist to give [place] permission and clearance to do the surgery.  I recommend that you stop the  aspirin 4 days prior to the surgery.    Inguinal Hernia, Adult Muscles help keep everything in the body in its proper place. But if a weak spot in the muscles develops, something can poke through. That is called a hernia. When this happens in the lower part of the belly (abdomen), it is called an inguinal hernia. (It takes its name from a part of the body in this region called the inguinal canal.) A weak spot in the wall of muscles lets some fat or part of the small intestine bulge through. An inguinal hernia can develop at any age. Men get them more often than women. CAUSES  In adults, an inguinal hernia develops over time.  It can be triggered by:  Suddenly straining the muscles of the lower abdomen.  Lifting heavy objects.  Straining to have a bowel movement. Difficult bowel movements (constipation) can lead to this.  Constant coughing. This may be caused by smoking or lung disease.  Being overweight.  Being pregnant.  Working at a job that requires long periods of standing or heavy lifting.  Having had an inguinal hernia before. One type can be an emergency situation. It is called a strangulated inguinal hernia. It develops if part of the small intestine slips through the weak spot and cannot get back into the abdomen. The blood supply can be cut off. If that happens, part of the intestine may die. This situation requires emergency surgery. SYMPTOMS  Often, a small inguinal hernia has no symptoms. It is found when a healthcare provider does a physical exam. Larger hernias usually have symptoms.   In adults, symptoms may include:  A lump in the groin. This is easier to see when the person is standing. It might disappear when lying  down.  In men, a lump in the scrotum.  Pain or burning in the groin. This occurs especially when lifting, straining or coughing.  A dull ache or feeling of pressure in the groin.  Signs of a strangulated hernia can include:  A bulge in the groin that becomes very painful and tender to the touch.  A bulge that turns red or purple.  Fever, nausea and vomiting.  Inability to have a bowel movement or to pass gas. DIAGNOSIS  To decide if you have an inguinal hernia, a healthcare provider will probably do a physical examination.  This will include asking questions about any symptoms you have noticed.  The healthcare provider might feel the groin area and ask you to cough. If an inguinal hernia is felt, the healthcare provider may try to slide it back into the abdomen.  Usually no other tests are needed. TREATMENT  Treatments can vary. The size of the hernia makes a difference. Options include:  Watchful waiting. This is often suggested if the hernia is small and you have had no symptoms.  No medical procedure will be done unless symptoms develop.  You will need to watch closely for symptoms. If any occur, contact your healthcare provider right away.  Surgery. This is used if the hernia is larger or you have symptoms.  Open surgery. This is usually an outpatient procedure (you will not stay overnight in a hospital). An cut (incision) is made through  the skin in the groin. The hernia is put back inside the abdomen. The weak area in the muscles is then repaired by herniorrhaphy or hernioplasty. Herniorrhaphy: in this type of surgery, the weak muscles are sewn back together. Hernioplasty: a patch or mesh is used to close the weak area in the abdominal wall.  Laparoscopy. In this procedure, a surgeon makes small incisions. A thin tube with a tiny video camera (called a laparoscope) is put into the abdomen. The surgeon repairs the hernia with mesh by looking with the video camera and using  two long instruments. HOME CARE INSTRUCTIONS   After surgery to repair an inguinal hernia:  You will need to take pain medicine prescribed by your healthcare provider. Follow all directions carefully.  You will need to take care of the wound from the incision.  Your activity will be restricted for awhile. This will probably include no heavy lifting for several weeks. You also should not do anything too active for a few weeks. When you can return to work will depend on the type of job that you have.  During "watchful waiting" periods, you should:  Maintain a healthy weight.  Eat a diet high in fiber (fruits, vegetables and whole grains).  Drink plenty of fluids to avoid constipation. This means drinking enough water and other liquids to keep your urine clear or pale yellow.  Do not lift heavy objects.  Do not stand for long periods of time.  Quit smoking. This should keep you from developing a frequent cough. SEEK MEDICAL CARE IF:   A bulge develops in your groin area.  You feel pain, a burning sensation or pressure in the groin. This might be worse if you are lifting or straining.  You develop a fever of more than 100.5 F (38.1 C). SEEK IMMEDIATE MEDICAL CARE IF:   Pain in the groin increases suddenly.  A bulge in the groin gets bigger suddenly and does not go down.  For men, there is sudden pain in the scrotum. Or, the size of the scrotum increases.  A bulge in the groin area becomes red or purple and is painful to touch.  You have nausea or vomiting that does not go away.  You feel your heart beating much faster than normal.  You cannot have a bowel movement or pass gas.  You develop a fever of more than 102.0 F (38.9 C). Document Released: 05/14/2008 Document Revised: 03/20/2011 Document Reviewed: 05/14/2008 [facility] Patient Information 2013 [place], [place].     Inguinal Hernia, Adult  Care After Refer to this sheet in the next few weeks. These discharge  instructions provide you with general information on caring for yourself after you leave the hospital. Your caregiver may also give you specific instructions. Your treatment has been planned according to the most current medical practices available, but unavoidable complications sometimes occur. If you have any problems or questions after discharge, please call your caregiver. HOME CARE INSTRUCTIONS  Put ice on the operative site.  Put ice in a plastic bag.  Place a towel between your skin and the bag.  Leave the ice on for 15 to 20 minutes at a time, 3 to 4 times a day while awake.  Change bandages (dressings) as directed.  Keep the wound dry and clean. The wound may be washed gently with soap and water. Gently blot or dab the wound dry. It is okay to take showers 24 to 48 hours after surgery. Do not take baths, use swimming  pools, or use hot tubs for 10 days, or as directed by your caregiver.  Only take over-the-counter or prescription medicines for pain, discomfort, or fever as directed by your caregiver.  Continue your normal diet as directed.  Do not lift anything more than 10 pounds or play contact sports for 3 weeks, or as directed. SEEK MEDICAL CARE IF:  There is redness, swelling, or increasing pain in the wound.  There is fluid (pus) coming from the wound.  There is drainage from a wound lasting longer than 1 day.  You have an oral temperature above 102 F (38.9 C).  You notice a bad smell coming from the wound or dressing.  The wound breaks open after the stitches (sutures) have been removed.  You notice increasing pain in the shoulders (shoulder strap areas).  You develop dizzy episodes or fainting while standing.  You feel sick to your stomach (nauseous) or throw up (vomit). SEEK IMMEDIATE MEDICAL CARE IF:  You develop a rash.  You have difficulty breathing.  You develop a reaction or have side effects to medicines you were given. MAKE SURE YOU:    Understand these instructions.  Will watch your condition.  Will get help right away if you are not doing well or get worse. Document Released: 01/26/2006 Document Revised: 03/20/2011 Document Reviewed: 11/25/2008 [facility] Patient Information 2013 [place], [place].

## 2012-02-08 NOTE — Progress Notes (Signed)
Faxed request for cardiac clearance from Dr. Derrell Lolling to the attention of Dr. Royann Shivers FAX # [phone]. Faxed office visit note, contact information and copy of visit information from [facility]. Confirmation received.

## 2012-02-08 NOTE — Progress Notes (Signed)
Patient ID: Richard [name], male   DOB: [birth year], 79 y.o.   MRN: [id number]  No chief complaint on file.   HPI Richard [name] is a 79 y.o. male.  He is referred by Dr. Pearson Grippe for evaluation of a right inguinal hernia. Dr. Benedict Needy is his cardiologist.  This patient has significant past medical history including myocardial infarction, coronary artery disease, coronary artery bypass grafting 2005. Stable cardiac-wise. Has mild mitral regurg and mild diastolic dysfunction. He has hypertension, non-insulin-dependent diabetes, history appendectomy, history cataract surgery, history left total ear placement, history penile prosthesis.  He states that he really doesn't have much pain in his right groin. His primary care physician pointed out the right inguinal hernia and he has noticed a bulge since then. No prior history of hernia  He is here today with his daughter. HPI  Past Medical History  Diagnosis Date  . Right inguinal hernia   . Insomnia   . Hypertension   . Diabetes mellitus without complication     type 2  . Hyperlipidemia   . CAD (coronary artery disease)     s/p MI in 2003  . Myocardial infarction 2003  . Osteoarthritis   . Microalbuminuria   . Vitamin D deficiency   . Mitral valve prolapse     borderline  . Mild mitral regurgitation by prior echocardiogram 02/10/09  . Mild diastolic dysfunction     slightly dilated left atrium  . Mild aortic insufficiency   . Barrett esophagus     history of  . Tubular adenoma 2011    on colonoscopy   . Diverticulosis     Past Surgical History  Procedure Date  . Cataract extraction 12/2007, 01/16/2008    right in 2009, left in 2010  . Appendectomy   . Eye surgery     Dr. Danton Clap  . Tibia fracture surgery     steel plate insertion  . Total knee arthroplasty     left  . Penile prosthesis implant 10/10/2000  . Esophagogastroduodenoscopy 09/13/2005    stricture and dilation  . Cardiac surgery 2005    quadruple bypass     Family History  Problem Relation Age of Onset  . Alzheimer's disease Mother   . Dementia Mother 53  . Cancer Father     lung, metastatic to brain    Social History History  Substance Use Topics  . Smoking status: Former Smoker -- 1.0 packs/day for 50 years    Types: Cigarettes    Quit date: 02/08/2000  . Smokeless tobacco: Not on file  . Alcohol Use: No    No Known Allergies  Current Outpatient Prescriptions  Medication Sig Dispense Refill  . allopurinol (ZYLOPRIM) 100 MG tablet Daily.      Marland Kitchen amLODipine (NORVASC) 10 MG tablet Daily.      Marland Kitchen aspirin 81 MG tablet Take 81 mg by mouth daily.      Marland Kitchen atorvastatin (LIPITOR) 10 MG tablet Take 10 mg by mouth daily.      . carvedilol (COREG) 6.25 MG tablet Twice daily.      . Cholecalciferol (VITAMIN D PO) Take by mouth.      . losartan (COZAAR) 100 MG tablet Daily.      . meclizine (ANTIVERT) 25 MG tablet Take 25 mg by mouth every 6 (six) hours as needed.      . metformin (FORTAMET) 1000 MG (OSM) 24 hr tablet Twice daily.      Marland Kitchen omeprazole (PRILOSEC) 20 MG  capsule Take 20 mg by mouth daily.      Marland Kitchen spironolactone (ALDACTONE) 25 MG tablet Daily.        Review of Systems Review of Systems  Constitutional: Negative for fever, chills and unexpected weight change.  HENT: Negative for hearing loss, congestion, sore throat, trouble swallowing and voice change.   Eyes: Negative for visual disturbance.  Respiratory: Negative for cough and wheezing.   Cardiovascular: Negative for chest pain, palpitations and leg swelling.  Gastrointestinal: Negative for nausea, vomiting, abdominal pain, diarrhea, constipation, blood in stool, abdominal distention, anal bleeding and rectal pain.  Genitourinary: Negative for hematuria and difficulty urinating.  Musculoskeletal: Negative for arthralgias.  Skin: Negative for rash and wound.  Neurological: Negative for seizures, syncope, weakness and headaches.  Hematological: Negative for adenopathy. Does  not bruise/bleed easily.  Psychiatric/Behavioral: Negative for confusion.    Blood pressure 122/62, pulse 72, temperature 98.1 F (36.7 C), temperature source Temporal, resp. rate 18, height 5\' 10"  (1.778 m), weight 178 lb 12.8 oz (81.103 kg).  Physical Exam Physical Exam  Constitutional: He is oriented to person, place, and time. He appears well-developed and well-nourished. No distress.  HENT:  Head: Normocephalic.  Nose: Nose normal.  Mouth/Throat: No oropharyngeal exudate.  Eyes: Conjunctivae normal and EOM are normal. Pupils are equal, round, and reactive to light. Right eye exhibits no discharge. Left eye exhibits no discharge. No scleral icterus.  Neck: Normal range of motion. Neck supple. No JVD present. No tracheal deviation present. No thyromegaly present.  Cardiovascular: Normal rate, regular rhythm, normal heart sounds and intact distal pulses.   No murmur heard. Pulmonary/Chest: Effort normal and breath sounds normal. No stridor. No respiratory distress. He has no wheezes. He has no rales. He exhibits no tenderness.       Sternotomy scar  Abdominal: Soft. Bowel sounds are normal. He exhibits no distension and no mass. There is no tenderness. There is no rebound and no guarding.         Right paramedian incision no hernia there.  Genitourinary:       Large, baseball sized right inguinal hernia, does not extend into the scrotum, reducible. No evidence of hernia on left. Penile prosthesis noted. Both testes descended.  Musculoskeletal: Normal range of motion. He exhibits no edema and no tenderness.  Lymphadenopathy:    He has no cervical adenopathy.  Neurological: He is alert and oriented to person, place, and time. He has normal reflexes. Coordination normal.  Skin: Skin is warm and dry. No rash noted. He is not diaphoretic. No erythema. No pallor.  Psychiatric: He has a normal mood and affect. His behavior is normal. Judgment and thought content normal.    Data  Reviewed Dr. Elmyra Ricks office note.  Assessment    Large, reducible right inguinal hernia  Coronary artery disease, status post myocardial infarction, status post coronary bypass grafting. Stable  Hypertension  Diabetes mellitus, type II, non-insulin-dependent  Mild mitral regurgitation  Mild diastolic dysfunction  History appendectomy  History penile prosthesis  GERD  History cataract surgery  History left total knee replacement    Plan    The patient and his daughter would like to have this hernia repaired before he develops a complication, and so we will schedule that. I think this should be done as an open repair because of the large incision of his abdominal wall.  We will tentatively schedule for open repair of right inguinal hernia with mesh in the future. Because his wife's health is not good  we will keep him overnight for observation.  I discussed the indications, details, technique, and numerous risks of the surgery with the patient and his daughter. They understand all these issues. All of their questions were answered. They agree with this plan.  I would like to discontinue his aspirin 3-4 days preop  Last for cardiac clearance with Dr. Benedict Needy, who he saw last month.       Siah Kannan M 02/08/2012, 12:35 PM

## 2012-02-12 ENCOUNTER — Encounter (INDEPENDENT_AMBULATORY_CARE_PROVIDER_SITE_OTHER): Payer: Self-pay | Admitting: General Surgery

## 2012-02-12 NOTE — Progress Notes (Signed)
Received fax from [facility] issuance of clearance for surgery received. Patient to stop aspirin 5 days pre-op and resume immediately after surgery. To be sent to medical records to be scanned into the chart under media.

## 2012-02-13 ENCOUNTER — Telehealth (INDEPENDENT_AMBULATORY_CARE_PROVIDER_SITE_OTHER): Payer: Self-pay

## 2012-02-13 NOTE — Telephone Encounter (Signed)
Patient advised he could take aspirin up to the day surgery. No Aspirin the day of surgery per orders Dr. Derrell Lolling  Patient voiced understanding.

## 2012-02-14 ENCOUNTER — Telehealth (INDEPENDENT_AMBULATORY_CARE_PROVIDER_SITE_OTHER): Payer: Self-pay | Admitting: General Surgery

## 2012-02-14 NOTE — Telephone Encounter (Signed)
Called to say his pain at his hernia site was getting worse.  It was very tender and swollen earlier today, but it got better and almost went away after he laid down.  No nausea.  I recommended an ice pack when he laid back down.  If the pain returned and worsened, I advised him to proceed to the ED.  I also stated that we could see him in urgent clinic tomorrow to check on it.  He is scheduled for repair in early March.

## 2012-02-15 ENCOUNTER — Telehealth (INDEPENDENT_AMBULATORY_CARE_PROVIDER_SITE_OTHER): Payer: Self-pay | Admitting: General Surgery

## 2012-02-15 NOTE — Telephone Encounter (Signed)
Pt's daughter called for advice on how to manage painful bulging from hernia until surgery can correct it.  (Surgery is set for 03/13/12 with Dr. Derrell Lolling.)  Advised pt to lay flat on his bed, with his feet up on bed as well, so that all his abdominal muscles are relaxed.  Gently push the bulging area back into his abdomen.  He can do this safely until the surgery.  If the bulge cannot be reduced to relieve the pain, he needs to go to the ER.  Daughter understands these instructions and is able to repeat them to me.

## 2012-03-04 ENCOUNTER — Encounter (HOSPITAL_COMMUNITY): Payer: Self-pay

## 2012-03-04 MED ORDER — ASPIRIN 81 MG PO CHEW
CHEWABLE_TABLET | ORAL | Status: AC
  Filled 2012-03-04: qty 1

## 2012-03-06 ENCOUNTER — Encounter (HOSPITAL_COMMUNITY)
Admission: RE | Admit: 2012-03-06 | Discharge: 2012-03-06 | Disposition: A | Discharge status: Home / Self Care | Payer: Medicare Other | Source: Ambulatory Visit | Attending: Anesthesiology | Admitting: Anesthesiology

## 2012-03-06 ENCOUNTER — Encounter (HOSPITAL_COMMUNITY)
Admission: RE | Admit: 2012-03-06 | Discharge: 2012-03-06 | Disposition: A | Discharge status: Home / Self Care | Payer: Medicare Other | Source: Ambulatory Visit | Attending: General Surgery | Admitting: General Surgery

## 2012-03-06 ENCOUNTER — Encounter (HOSPITAL_COMMUNITY): Payer: Self-pay

## 2012-03-06 LAB — CBC WITH DIFFERENTIAL/PLATELET
Basophils Relative: 1 % (ref 0–1)
Hemoglobin: 12.3 g/dL — ABNORMAL LOW (ref 13.0–17.0)
MCHC: 34.6 g/dL (ref 30.0–36.0)
Monocytes Relative: 9 % (ref 3–12)
Neutro Abs: 2 10*3/uL (ref 1.7–7.7)
Neutrophils Relative %: 38 % — ABNORMAL LOW (ref 43–77)
Platelets: 189 10*3/uL (ref 150–400)
RBC: 3.95 MIL/uL — ABNORMAL LOW (ref 4.22–5.81)

## 2012-03-06 LAB — URINALYSIS, ROUTINE W REFLEX MICROSCOPIC
Glucose, UA: NEGATIVE mg/dL
Hgb urine dipstick: NEGATIVE
Ketones, ur: 15 mg/dL — AB
Protein, ur: 30 mg/dL — AB
pH: 5.5 (ref 5.0–8.0)

## 2012-03-06 LAB — COMPREHENSIVE METABOLIC PANEL
ALT: 6 U/L (ref 0–53)
AST: 11 U/L (ref 0–37)
Albumin: 3.9 g/dL (ref 3.5–5.2)
Alkaline Phosphatase: 99 U/L (ref 39–117)
BUN: 20 mg/dL (ref 6–23)
Chloride: 103 mEq/L (ref 96–112)
Potassium: 4.2 mEq/L (ref 3.5–5.1)
Sodium: 139 mEq/L (ref 135–145)
Total Bilirubin: 0.4 mg/dL (ref 0.3–1.2)

## 2012-03-06 LAB — URINE MICROSCOPIC-ADD ON

## 2012-03-06 LAB — SURGICAL PCR SCREEN
MRSA, PCR: NEGATIVE
Staphylococcus aureus: NEGATIVE

## 2012-03-06 NOTE — Pre-Procedure Instructions (Addendum)
Richard [name]  03/06/2012   Your procedure is scheduled on:03/13/12  Report to [facility] at 12 noon AM.  Call this number if you have problems the morning of surgery: [phone]   Remember:   Do not eat food or drink liquids after midnight.   Take these medicines the morning of surgery with A SIP OF WATER: allopurinol, amlodipine, carvedelol  STOP aspirin, any nsaids, herbal meds  03/08/12   Do not wear jewelry, make-up or nail polish.  Do not wear lotions, powders, or perfumes. You may wear deodorant.  Do not shave 48 hours prior to surgery. Men may shave face and neck.  Do not bring valuables to the hospital.  Contacts, dentures or bridgework may not be worn into surgery.  Leave suitcase in the car. After surgery it may be brought to your room.  For patients admitted to the hospital, checkout time is 11:00 AM the day of  discharge.   Patients discharged the day of surgery will not be allowed to drive  home.  Name and phone number of your driver:   Special Instructions: Shower using CHG 2 nights before surgery and the night before surgery.  If you shower the day of surgery use CHG.  Use special wash - you have one bottle of CHG for all showers.  You should use approximately 1/3 of the bottle for each shower.   Please read over the following fact sheets that you were given: Pain Booklet, Coughing and Deep Breathing, MRSA Information and Surgical Site Infection Prevention   2

## 2012-03-07 NOTE — Progress Notes (Signed)
Anesthesia Chart Review:  Patient is a 79 year old male scheduled for open repair of right inguinal hernia with mesh on 03/13/12 by Dr. Derrell Lolling.  Other history includes former smoker, DM2, CAD/MI s/p CABG (LIMA to LAD, SVG to OM, seq SVG to 2 INT branches, SVT to RCA) '03, HTN, HLD, Barrett's esophagus, OA, diverticulosis, tubular adenoma, penile prosthesis.  Cardiologist is Dr. Royann Shivers [facility]), last visit on 01/23/12. 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  He cleared patient for this procedure with low to moderate cardiovascular risk.     EKG on 03/06/12 showed SB @ 59 bpm, occasional PVC, possible anterior infarct (age undetermined).  The PVC is new, but otherwise his EKG appears stable since 01/23/12.    Nuclear stress test on 03/15/2011 showed perfusion defect in the inferior myocardial region consistent with diaphragmatic attenuation. The remaining myocardium demonstrates normal myocardial perfusion with no evidence of ischemia or infarct. The post stress left ventricle size is normal. There is no scintigraphic evidence of inducible myocardial ischemia. Post stress EF 51%. Septal wall motion abnormality consistent with postoperative state. Compared to his previous study results were felt unchanged. Low risk scan.  Echo on 03/02/2009 showed normal LV systolic function, postoperative paradoxical septal motion, mild diastolic dysfunction (impaired relaxation). Dilated left atrium, degenerative changes of the mitral and aortic valves not hemodynamically significant, mild mitral insufficiency, mild aortic insufficiency, no pericardial effusion.  His last cath was in 12/2001 prior to his CABG.  CXR on 03/06/12 showed previous CABG, now acute cardiopulmonary abnormalities.  Preoperative labs noted.  Patient has been cleared for this procedure by his cardiologist. If there is no significant change in his status then would  anticipate he could proceed as planned.  Shonna Chock, PA-C 03/07/12 1442

## 2012-03-08 ENCOUNTER — Telehealth (INDEPENDENT_AMBULATORY_CARE_PROVIDER_SITE_OTHER): Payer: Self-pay | Admitting: *Deleted

## 2012-03-08 NOTE — Telephone Encounter (Signed)
Daughter called to request some pain medication to help the patient through his pain until surgery.  Message was sent to [facility] MD at this time.  Awaiting response.

## 2012-03-08 NOTE — Telephone Encounter (Signed)
Spoke to [place] MD who approved Norco 5/325mg  1-2 every 4-6 hours as needed for pain #30 no refills.  Also will call to check on patient to make sure hernia is still stable per Derrell Lolling MD.

## 2012-03-08 NOTE — Telephone Encounter (Signed)
Called and spoke to patients daughter Sheralyn Boatman) to let her know I have called in the prescription to [facility] [phone].  Sheralyn Boatman will get in touch with the patient to ask about any decline in symptoms with the hernia and will call back to update this RN.

## 2012-03-12 MED ORDER — CEFAZOLIN SODIUM-DEXTROSE 2-3 GM-% IV SOLR
2.0000 g | INTRAVENOUS | Status: AC
  Administered 2012-03-13: 2 g via INTRAVENOUS
  Filled 2012-03-12: qty 50

## 2012-03-12 NOTE — H&P (Signed)
Richard [name]   MRN:  [id number]   Description: 79 year old male  Emmanuela Ghazi: Ernestene Mention, MD  Department: [facility]        Diagnoses    Right inguinal hernia    -  Primary    550.90         Current Vitals -   BP Pulse Temp(Src) Resp Ht Wt    122/62 72 98.1 F (36.7 C) (Temporal) 18 5\' 10"  (1.778 m) 178 lb 12.8 oz (81.103 kg)       BMI 25.66 kg/m2                 History and Physical   Ernestene Mention, MD    Status: Signed                 .     HPI Richard [name] is a 79 y.o. male.  He is referred by Dr. Pearson Grippe for evaluation of a right inguinal hernia. Dr. Benedict Needy is his cardiologist.   This patient has significant past medical history including myocardial infarction, coronary artery disease, coronary artery bypass grafting 2005. Stable cardiac-wise. Has mild mitral regurg and mild diastolic dysfunction. He has hypertension, non-insulin-dependent diabetes, history appendectomy, history cataract surgery, history left total ear placement, history penile prosthesis.   He states that he really doesn't have much pain in his right groin. His primary care physician pointed out the right inguinal hernia and he has noticed a bulge since then. No prior history of hernia   He is here today with his daughter.       Past Medical History   Diagnosis  Date   .  Right inguinal hernia     .  Insomnia     .  Hypertension     .  Diabetes mellitus without complication         type 2   .  Hyperlipidemia     .  CAD (coronary artery disease)         s/p MI in 2003   .  Myocardial infarction  2003   .  Osteoarthritis     .  Microalbuminuria     .  Vitamin D deficiency     .  Mitral valve prolapse         borderline   .  Mild mitral regurgitation by prior echocardiogram  02/10/09   .  Mild diastolic dysfunction         slightly dilated left atrium   .  Mild aortic insufficiency     .  Barrett esophagus         history of   .  Tubular adenoma  2011       on colonoscopy    .  Diverticulosis           Past Surgical History   Procedure  Date   .  Cataract extraction  12/2007, 01/16/2008       right in 2009, left in 2010   .  Appendectomy     .  Eye surgery         Dr. Danton Clap   .  Tibia fracture surgery         steel plate insertion   .  Total knee arthroplasty         left   .  Penile prosthesis implant  10/10/2000   .  Esophagogastroduodenoscopy  09/13/2005  stricture and dilation   .  Cardiac surgery  2005       quadruple bypass         Family History   Problem  Relation  Age of Onset   .  Alzheimer's disease  Mother     .  Dementia  Mother  63   .  Cancer  Father         lung, metastatic to brain        Social History History   Substance Use Topics   .  Smoking status:  Former Smoker -- 1.0 packs/day for 50 years       Types:  Cigarettes       Quit date:  02/08/2000   .  Smokeless tobacco:  Not on file   .  Alcohol Use:  No        No Known Allergies    Current Outpatient Prescriptions   Medication  Sig  Dispense  Refill   .  allopurinol (ZYLOPRIM) 100 MG tablet  Daily.         Marland Kitchen  amLODipine (NORVASC) 10 MG tablet  Daily.         Marland Kitchen  aspirin 81 MG tablet  Take 81 mg by mouth daily.         Marland Kitchen  atorvastatin (LIPITOR) 10 MG tablet  Take 10 mg by mouth daily.         .  carvedilol (COREG) 6.25 MG tablet  Twice daily.         .  Cholecalciferol (VITAMIN D PO)  Take by mouth.         .  losartan (COZAAR) 100 MG tablet  Daily.         .  meclizine (ANTIVERT) 25 MG tablet  Take 25 mg by mouth every 6 (six) hours as needed.         .  metformin (FORTAMET) 1000 MG (OSM) 24 hr tablet  Twice daily.         Marland Kitchen  omeprazole (PRILOSEC) 20 MG capsule  Take 20 mg by mouth daily.         Marland Kitchen  spironolactone (ALDACTONE) 25 MG tablet  Daily.              Review of Systems   Constitutional: Negative for fever, chills and unexpected weight change.  HENT: Negative for hearing loss, congestion, sore throat,  trouble swallowing and voice change.   Eyes: Negative for visual disturbance.  Respiratory: Negative for cough and wheezing.   Cardiovascular: Negative for chest pain, palpitations and leg swelling.  Gastrointestinal: Negative for nausea, vomiting, abdominal pain, diarrhea, constipation, blood in stool, abdominal distention, anal bleeding and rectal pain.  Genitourinary: Negative for hematuria and difficulty urinating.  Musculoskeletal: Negative for arthralgias.  Skin: Negative for rash and wound.  Neurological: Negative for seizures, syncope, weakness and headaches.  Hematological: Negative for adenopathy. Does not bruise/bleed easily.  Psychiatric/Behavioral: Negative for confusion.      Blood pressure 122/62, pulse 72, temperature 98.1 F (36.7 C), temperature source Temporal, resp. rate 18, height 5\' 10"  (1.778 m), weight 178 lb 12.8 oz (81.103 kg).   Physical Exam    Constitutional: He is oriented to person, place, and time. He appears well-developed and well-nourished. No distress.  HENT:   Head: Normocephalic.   Nose: Nose normal.   Mouth/Throat: No oropharyngeal exudate.  Eyes: Conjunctivae normal and EOM are normal. Pupils are equal, round, and reactive to light. Right eye  exhibits no discharge. Left eye exhibits no discharge. No scleral icterus.  Neck: Normal range of motion. Neck supple. No JVD present. No tracheal deviation present. No thyromegaly present.  Cardiovascular: Normal rate, regular rhythm, normal heart sounds and intact distal pulses.    No murmur heard. Pulmonary/Chest: Effort normal and breath sounds normal. No stridor. No respiratory distress. He has no wheezes. He has no rales. He exhibits no tenderness.       Sternotomy scar  Abdominal: Soft. Bowel sounds are normal. He exhibits no distension and no mass. There is no tenderness. There is no rebound and no guarding.         Right paramedian incision no hernia there.  Genitourinary:       Large,  baseball sized right inguinal hernia, does not extend into the scrotum, reducible. No evidence of hernia on left. Penile prosthesis noted. Both testes descended.  Musculoskeletal: Normal range of motion. He exhibits no edema and no tenderness.  Lymphadenopathy:    He has no cervical adenopathy.  Neurological: He is alert and oriented to person, place, and time. He has normal reflexes. Coordination normal.  Skin: Skin is warm and dry. No rash noted. He is not diaphoretic. No erythema. No pallor.  Psychiatric: He has a normal mood and affect. His behavior is normal. Judgment and thought content normal.      Data Reviewed Dr. Elmyra Ricks office note.   Assessment    Large, reducible right inguinal hernia   Coronary artery disease, status post myocardial infarction, status post coronary bypass grafting. Stable   Hypertension   Diabetes mellitus, type II, non-insulin-dependent   Mild mitral regurgitation   Mild diastolic dysfunction   History appendectomy   History penile prosthesis   GERD   History cataract surgery   History left total knee replacement     Plan    The patient and his daughter would like to have this hernia repaired before he develops a complication, and so we will schedule that. I think this should be done as an open repair because of the large incision of his abdominal wall.   We will tentatively schedule for open repair of right inguinal hernia with mesh in the future. Because his wife's health is not good we will keep him overnight for observation.   I discussed the indications, details, technique, and numerous risks of the surgery with the patient and his daughter. They understand all these issues. All of their questions were answered. They agree with this plan.   I would like to discontinue his aspirin 3-4 days preop  Ask for cardiac clearance with Dr. Benedict Needy, who he saw last month.          Angelia Mould. Derrell Lolling, M.D., [facility] General and Minimally invasive Surgery Breast and Colorectal Surgery Office:   [phone] Pager:   [phone]

## 2012-03-13 ENCOUNTER — Ambulatory Visit (HOSPITAL_COMMUNITY): Payer: Medicare Other | Admitting: Anesthesiology

## 2012-03-13 ENCOUNTER — Encounter (HOSPITAL_COMMUNITY): Payer: Self-pay | Admitting: Vascular Surgery

## 2012-03-13 ENCOUNTER — Ambulatory Visit (HOSPITAL_COMMUNITY)
Admission: RE | Admit: 2012-03-13 | Discharge: 2012-03-14 | Disposition: A | Discharge status: Home / Self Care | Payer: Medicare Other | Source: Ambulatory Visit | Attending: General Surgery | Admitting: General Surgery

## 2012-03-13 ENCOUNTER — Encounter (HOSPITAL_COMMUNITY): Payer: Self-pay | Admitting: Anesthesiology

## 2012-03-13 ENCOUNTER — Encounter (HOSPITAL_COMMUNITY)
Admission: RE | Disposition: A | Discharge status: Home / Self Care | Payer: Self-pay | Source: Ambulatory Visit | Attending: General Surgery

## 2012-03-13 DIAGNOSIS — Z01812 Encounter for preprocedural laboratory examination: Secondary | ICD-10-CM | POA: Insufficient documentation

## 2012-03-13 DIAGNOSIS — K4090 Unilateral inguinal hernia, without obstruction or gangrene, not specified as recurrent: Secondary | ICD-10-CM | POA: Insufficient documentation

## 2012-03-13 DIAGNOSIS — E119 Type 2 diabetes mellitus without complications: Secondary | ICD-10-CM | POA: Insufficient documentation

## 2012-03-13 DIAGNOSIS — I4949 Other premature depolarization: Secondary | ICD-10-CM | POA: Insufficient documentation

## 2012-03-13 DIAGNOSIS — Z01818 Encounter for other preprocedural examination: Secondary | ICD-10-CM | POA: Insufficient documentation

## 2012-03-13 DIAGNOSIS — I10 Essential (primary) hypertension: Secondary | ICD-10-CM | POA: Insufficient documentation

## 2012-03-13 DIAGNOSIS — Z79899 Other long term (current) drug therapy: Secondary | ICD-10-CM | POA: Insufficient documentation

## 2012-03-13 DIAGNOSIS — Z01810 Encounter for preprocedural cardiovascular examination: Secondary | ICD-10-CM | POA: Insufficient documentation

## 2012-03-13 HISTORY — PX: INGUINAL HERNIA REPAIR: SHX194

## 2012-03-13 HISTORY — PX: INSERTION OF MESH: SHX5868

## 2012-03-13 LAB — GLUCOSE, CAPILLARY

## 2012-03-13 SURGERY — REPAIR, HERNIA, INGUINAL, ADULT
Anesthesia: General | Site: Groin | Laterality: Right | Wound class: Clean

## 2012-03-13 MED ORDER — MORPHINE SULFATE 2 MG/ML IJ SOLN: 2.0000 mg | INTRAMUSCULAR | Status: DC | PRN

## 2012-03-13 MED ORDER — LACTATED RINGERS IV SOLN
INTRAVENOUS | Status: DC
  Administered 2012-03-13: 13:00:00 via INTRAVENOUS

## 2012-03-13 MED ORDER — ALLOPURINOL 100 MG PO TABS
100.0000 mg | ORAL_TABLET | Freq: Every day | ORAL | Status: DC
  Filled 2012-03-13 (×2): qty 1

## 2012-03-13 MED ORDER — FENTANYL CITRATE 0.05 MG/ML IJ SOLN
INTRAMUSCULAR | Status: DC | PRN
  Administered 2012-03-13: 150 ug via INTRAVENOUS

## 2012-03-13 MED ORDER — INSULIN ASPART 100 UNIT/ML ~~LOC~~ SOLN: 0.0000 [IU] | Freq: Three times a day (TID) | SUBCUTANEOUS | Status: DC

## 2012-03-13 MED ORDER — CHLORHEXIDINE GLUCONATE 4 % EX LIQD: 1.0000 "application " | Freq: Once | CUTANEOUS | Status: DC

## 2012-03-13 MED ORDER — POTASSIUM CHLORIDE IN NACL 20-0.9 MEQ/L-% IV SOLN
INTRAVENOUS | Status: DC
  Administered 2012-03-13: 20:00:00 via INTRAVENOUS
  Filled 2012-03-13 (×3): qty 1000

## 2012-03-13 MED ORDER — ONDANSETRON HCL 4 MG/2ML IJ SOLN: 4.0000 mg | Freq: Four times a day (QID) | INTRAMUSCULAR | Status: DC | PRN

## 2012-03-13 MED ORDER — METFORMIN HCL ER 500 MG PO TB24
1000.0000 mg | ORAL_TABLET | Freq: Every day | ORAL | Status: DC
  Filled 2012-03-13 (×2): qty 2

## 2012-03-13 MED ORDER — LIDOCAINE HCL (CARDIAC) 20 MG/ML IV SOLN
INTRAVENOUS | Status: DC | PRN
  Administered 2012-03-13: 80 mg via INTRAVENOUS

## 2012-03-13 MED ORDER — BUPIVACAINE-EPINEPHRINE (PF) 0.5% -1:200000 IJ SOLN
INTRAMUSCULAR | Status: AC
  Filled 2012-03-13: qty 10

## 2012-03-13 MED ORDER — NEOSTIGMINE METHYLSULFATE 1 MG/ML IJ SOLN
INTRAMUSCULAR | Status: DC | PRN
  Administered 2012-03-13: 4 mg via INTRAVENOUS

## 2012-03-13 MED ORDER — HEPARIN SODIUM (PORCINE) 5000 UNIT/ML IJ SOLN
5000.0000 [IU] | Freq: Once | INTRAMUSCULAR | Status: AC
  Administered 2012-03-13: 5000 [IU] via SUBCUTANEOUS

## 2012-03-13 MED ORDER — HYDROMORPHONE HCL PF 1 MG/ML IJ SOLN: 0.2500 mg | INTRAMUSCULAR | Status: DC | PRN

## 2012-03-13 MED ORDER — CEFAZOLIN SODIUM-DEXTROSE 2-3 GM-% IV SOLR
2.0000 g | Freq: Three times a day (TID) | INTRAVENOUS | Status: AC
  Administered 2012-03-13: 2 g via INTRAVENOUS
  Filled 2012-03-13: qty 50

## 2012-03-13 MED ORDER — HEPARIN SODIUM (PORCINE) 5000 UNIT/ML IJ SOLN
INTRAMUSCULAR | Status: AC
  Filled 2012-03-13: qty 1

## 2012-03-13 MED ORDER — LOSARTAN POTASSIUM 50 MG PO TABS
100.0000 mg | ORAL_TABLET | Freq: Every day | ORAL | Status: DC
  Filled 2012-03-13 (×2): qty 2

## 2012-03-13 MED ORDER — LACTATED RINGERS IV SOLN
INTRAVENOUS | Status: DC | PRN
  Administered 2012-03-13: 13:00:00 via INTRAVENOUS

## 2012-03-13 MED ORDER — EPHEDRINE SULFATE 50 MG/ML IJ SOLN
INTRAMUSCULAR | Status: DC | PRN
  Administered 2012-03-13: 10 mg via INTRAVENOUS

## 2012-03-13 MED ORDER — 0.9 % SODIUM CHLORIDE (POUR BTL) OPTIME
TOPICAL | Status: DC | PRN
  Administered 2012-03-13: 1000 mL

## 2012-03-13 MED ORDER — SPIRONOLACTONE 25 MG PO TABS
25.0000 mg | ORAL_TABLET | Freq: Every day | ORAL | Status: DC
  Administered 2012-03-13: 25 mg via ORAL
  Filled 2012-03-13 (×2): qty 1

## 2012-03-13 MED ORDER — AMLODIPINE BESYLATE 10 MG PO TABS
10.0000 mg | ORAL_TABLET | Freq: Every day | ORAL | Status: DC
  Administered 2012-03-13: 10 mg via ORAL
  Filled 2012-03-13 (×2): qty 1

## 2012-03-13 MED ORDER — ONDANSETRON HCL 4 MG/2ML IJ SOLN
INTRAMUSCULAR | Status: DC | PRN
  Administered 2012-03-13: 4 mg via INTRAVENOUS

## 2012-03-13 MED ORDER — ASPIRIN EC 81 MG PO TBEC
81.0000 mg | DELAYED_RELEASE_TABLET | Freq: Every day | ORAL | Status: DC
  Administered 2012-03-13: 81 mg via ORAL
  Filled 2012-03-13 (×2): qty 1

## 2012-03-13 MED ORDER — CARVEDILOL 6.25 MG PO TABS
6.2500 mg | ORAL_TABLET | Freq: Two times a day (BID) | ORAL | Status: DC
  Administered 2012-03-14: 6.25 mg via ORAL
  Filled 2012-03-13 (×3): qty 1

## 2012-03-13 MED ORDER — MIDAZOLAM HCL 5 MG/5ML IJ SOLN
INTRAMUSCULAR | Status: DC | PRN
  Administered 2012-03-13: 1 mg via INTRAVENOUS

## 2012-03-13 MED ORDER — OXYCODONE-ACETAMINOPHEN 5-325 MG PO TABS
1.0000 | ORAL_TABLET | ORAL | Status: DC | PRN
  Administered 2012-03-13 – 2012-03-14 (×2): 2 via ORAL
  Filled 2012-03-13 (×2): qty 2

## 2012-03-13 MED ORDER — ATORVASTATIN CALCIUM 10 MG PO TABS
10.0000 mg | ORAL_TABLET | Freq: Every day | ORAL | Status: DC
Start: 2012-03-13 — End: 2012-03-14
  Administered 2012-03-13: 10 mg via ORAL
  Filled 2012-03-13 (×2): qty 1

## 2012-03-13 MED ORDER — HEPARIN SODIUM (PORCINE) 5000 UNIT/ML IJ SOLN
5000.0000 [IU] | Freq: Three times a day (TID) | INTRAMUSCULAR | Status: DC
  Filled 2012-03-13 (×3): qty 1

## 2012-03-13 MED ORDER — GLYCOPYRROLATE 0.2 MG/ML IJ SOLN
INTRAMUSCULAR | Status: DC | PRN
  Administered 2012-03-13: 0.6 mg via INTRAVENOUS

## 2012-03-13 MED ORDER — PROPOFOL 10 MG/ML IV BOLUS
INTRAVENOUS | Status: DC | PRN
  Administered 2012-03-13: 150 mg via INTRAVENOUS

## 2012-03-13 MED ORDER — ROCURONIUM BROMIDE 100 MG/10ML IV SOLN
INTRAVENOUS | Status: DC | PRN
  Administered 2012-03-13: 40 mg via INTRAVENOUS

## 2012-03-13 MED ORDER — ONDANSETRON HCL 4 MG PO TABS: 4.0000 mg | ORAL_TABLET | Freq: Four times a day (QID) | ORAL | Status: DC | PRN

## 2012-03-13 MED ORDER — INSULIN GLARGINE 100 UNIT/ML ~~LOC~~ SOLN
5.0000 [IU] | Freq: Every day | SUBCUTANEOUS | Status: DC
  Administered 2012-03-13: 5 [IU] via SUBCUTANEOUS

## 2012-03-13 MED ORDER — BUPIVACAINE-EPINEPHRINE 0.5% -1:200000 IJ SOLN
INTRAMUSCULAR | Status: DC | PRN
  Administered 2012-03-13: 19 mL

## 2012-03-13 MED ORDER — MECLIZINE HCL 25 MG PO TABS
25.0000 mg | ORAL_TABLET | Freq: Four times a day (QID) | ORAL | Status: DC | PRN
  Filled 2012-03-13: qty 1

## 2012-03-13 SURGICAL SUPPLY — 55 items
ADH SKN CLS APL DERMABOND .7 (GAUZE/BANDAGES/DRESSINGS) ×1
BLADE SURG 10 STRL SS (BLADE) ×2 IMPLANT
BLADE SURG 15 STRL LF DISP TIS (BLADE) ×1 IMPLANT
BLADE SURG 15 STRL SS (BLADE) ×2
BLADE SURG ROTATE 9660 (MISCELLANEOUS) ×1 IMPLANT
CANISTER SUCTION 2500CC (MISCELLANEOUS) ×1 IMPLANT
CHLORAPREP W/TINT 26ML (MISCELLANEOUS) ×2 IMPLANT
CLOTH BEACON ORANGE TIMEOUT ST (SAFETY) ×2 IMPLANT
COVER SURGICAL LIGHT HANDLE (MISCELLANEOUS) ×2 IMPLANT
DERMABOND ADVANCED (GAUZE/BANDAGES/DRESSINGS) ×1
DERMABOND ADVANCED .7 DNX12 (GAUZE/BANDAGES/DRESSINGS) ×1 IMPLANT
DRAIN PENROSE 1/2X12 LTX STRL (WOUND CARE) ×1 IMPLANT
DRAPE LAPAROTOMY TRNSV 102X78 (DRAPE) ×2 IMPLANT
DRAPE UTILITY 15X26 W/TAPE STR (DRAPE) ×4 IMPLANT
ELECT CAUTERY BLADE 6.4 (BLADE) ×2 IMPLANT
ELECT REM PT RETURN 9FT ADLT (ELECTROSURGICAL) ×2
ELECTRODE REM PT RTRN 9FT ADLT (ELECTROSURGICAL) ×1 IMPLANT
GLOVE BIO SURGEON STRL SZ7.5 (GLOVE) ×1 IMPLANT
GLOVE BIOGEL PI IND STRL 7.0 (GLOVE) IMPLANT
GLOVE BIOGEL PI IND STRL 7.5 (GLOVE) IMPLANT
GLOVE BIOGEL PI INDICATOR 7.0 (GLOVE) ×1
GLOVE BIOGEL PI INDICATOR 7.5 (GLOVE) ×1
GLOVE EUDERMIC 7 POWDERFREE (GLOVE) ×2 IMPLANT
GOWN PREVENTION PLUS XLARGE (GOWN DISPOSABLE) ×2 IMPLANT
GOWN STRL NON-REIN LRG LVL3 (GOWN DISPOSABLE) ×2 IMPLANT
KIT BASIN OR (CUSTOM PROCEDURE TRAY) ×2 IMPLANT
KIT ROOM TURNOVER OR (KITS) ×2 IMPLANT
MESH ULTRAPRO 3X6 7.6X15CM (Mesh General) ×1 IMPLANT
NDL HYPO 25GX1X1/2 BEV (NEEDLE) ×1 IMPLANT
NEEDLE HYPO 25GX1X1/2 BEV (NEEDLE) ×2 IMPLANT
NS IRRIG 1000ML POUR BTL (IV SOLUTION) ×2 IMPLANT
PACK SURGICAL SETUP 50X90 (CUSTOM PROCEDURE TRAY) ×2 IMPLANT
PAD ARMBOARD 7.5X6 YLW CONV (MISCELLANEOUS) ×2 IMPLANT
PENCIL BUTTON HOLSTER BLD 10FT (ELECTRODE) ×2 IMPLANT
SPECIMEN JAR SMALL (MISCELLANEOUS) IMPLANT
SPONGE INTESTINAL PEANUT (DISPOSABLE) ×1 IMPLANT
SPONGE LAP 18X18 X RAY DECT (DISPOSABLE) ×2 IMPLANT
SPONGE LAP 4X18 X RAY DECT (DISPOSABLE) ×1 IMPLANT
SUT MNCRL AB 4-0 PS2 18 (SUTURE) ×2 IMPLANT
SUT PROLENE 2 0 CT2 30 (SUTURE) ×6 IMPLANT
SUT SILK 2 0 (SUTURE) ×2
SUT SILK 2 0 SH (SUTURE) IMPLANT
SUT SILK 2-0 18XBRD TIE 12 (SUTURE) ×1 IMPLANT
SUT VIC AB 2-0 CT1 27 (SUTURE) ×4
SUT VIC AB 2-0 CT1 TAPERPNT 27 (SUTURE) ×1 IMPLANT
SUT VIC AB 3-0 SH 27 (SUTURE) ×2
SUT VIC AB 3-0 SH 27XBRD (SUTURE) ×1 IMPLANT
SUT VICRYL AB 2 0 TIES (SUTURE) ×2 IMPLANT
SYR BULB 3OZ (MISCELLANEOUS) ×2 IMPLANT
SYR CONTROL 10ML LL (SYRINGE) ×2 IMPLANT
TOWEL OR 17X24 6PK STRL BLUE (TOWEL DISPOSABLE) ×1 IMPLANT
TOWEL OR 17X26 10 PK STRL BLUE (TOWEL DISPOSABLE) ×2 IMPLANT
TOWEL OR NON WOVEN STRL DISP B (DISPOSABLE) ×1 IMPLANT
TUBE CONNECTING 12X1/4 (SUCTIONS) ×1 IMPLANT
YANKAUER SUCT BULB TIP NO VENT (SUCTIONS) ×1 IMPLANT

## 2012-03-13 NOTE — Anesthesia Postprocedure Evaluation (Signed)
  Anesthesia Post-op Note  Patient: Richard [name]  Procedure(s) Performed: Procedure(s): HERNIA REPAIR INGUINAL ADULT (Right) INSERTION OF MESH (Right)  Patient Location: PACU  Anesthesia Type:General  Level of Consciousness: awake  Airway and Oxygen Therapy: Patient Spontanous Breathing  Post-op Pain: mild  Post-op Assessment: Post-op Vital signs reviewed  Post-op Vital Signs: Reviewed  Complications: No apparent anesthesia complications

## 2012-03-13 NOTE — Anesthesia Preprocedure Evaluation (Signed)
Anesthesia Evaluation  Patient identified by MRN, date of birth, ID band Patient awake    Reviewed: Allergy & Precautions, H&P , NPO status , Patient's Chart, lab work & pertinent test results  Airway Mallampati: II      Dental   Pulmonary neg pulmonary ROS,  breath sounds clear to auscultation        Cardiovascular hypertension, + CAD and + Past MI Rhythm:Regular Rate:Normal     Neuro/Psych    GI/Hepatic Neg liver ROS,   Endo/Other  diabetes  Renal/GU      Musculoskeletal   Abdominal   Peds  Hematology   Anesthesia Other Findings   Reproductive/Obstetrics                           Anesthesia Physical Anesthesia Plan  ASA: III  Anesthesia Plan: General   Post-op Pain Management:    Induction: Intravenous  Airway Management Planned: Oral ETT  Additional Equipment:   Intra-op Plan:   Post-operative Plan: Extubation in OR  Informed Consent: I have reviewed the patients History and Physical, chart, labs and discussed the procedure including the risks, benefits and alternatives for the proposed anesthesia with the patient or authorized representative who has indicated his/her understanding and acceptance.   Dental advisory given  Plan Discussed with: CRNA, Anesthesiologist and Surgeon  Anesthesia Plan Comments:         Anesthesia Quick Evaluation

## 2012-03-13 NOTE — Progress Notes (Signed)
Pt resting comfortably, HR 45 SB with occaisonal PVC's, pt going to Tele floor, Dr.Edwards and Dr.INgram notified, no new orders rec'd, will cont to assess

## 2012-03-13 NOTE — Op Note (Signed)
Patient Name:           Richard [name]   Date of Surgery:        03/13/2012  Pre op Diagnosis:      Right inguinal hernia  Post op Diagnosis:    Right inguinal hernia  Procedure:                 Open repair right inguinal hernia with ultra Pro mesh  Surgeon:                     Angelia Mould. Derrell Lolling, M.D., FACS  Assistant:                      none  Operative Indications:   Richard [name] is a 79 y.o. male. He is referred by Dr. Pearson Grippe for evaluation of a right inguinal hernia. Dr. Benedict Needy is his cardiologist.  This patient has significant past medical history including myocardial infarction, coronary artery disease, coronary artery bypass grafting 2005. Stable cardiac-wise. Has mild mitral regurg. and mild diastolic dysfunction. He has hypertension, non-insulin-dependent diabetes, history appendectomy, history cataract surgery, history left total ear placement, history penile prosthesis.  He states that he really doesn't have much pain in his right groin. His primary care physician pointed out the right inguinal hernia and he has noticed a bulge since then. My examination reveals a baseball sized right inguinal hernia that does not extend into the scrotum but is reducible when supine. No hernia on the left. Penile prosthesis noted. No prior history of hernia   Operative Findings:       There was a lot of fatty tissue in the deep subcutaneous space as well as in the inguinal canal. There was a large right  indirect hernia sac which was empty. No other abnormalities were noted.  Procedure in Detail:          Following the induction of general endotracheal anesthesia the patient's lower abdomen, groin and genitalia were prepped and draped in a sterile fashion. Intravenous antibiotics were given. Surgical time out was performed. 0.5% Marcaine with epinephrine was used as a local infiltration anesthetic into the subcutaneous tissue and into the deep muscle layers. A transverse incision was made in the  right groin overlying the inguinal canal. Dissection was carried down through the subcutaneous tissue exposing the aponeurosis of the external oblique. The external oblique was incised in the direction of its fibers opening up the external inguinal ring. The external oblique was dissected away from the underlying structures and self-retaining retractors were placed. The cord structures were mobilized and encircled with a Penrose drain. I dissected out the cord structures, skeletonizing the cremasteric fibers. I found a very large, but empty indirect sac. This sac was dissected away from the cord structures all the back to the internal ring. The indirect sac was opened and inspected and found to be empty. The indirect sac was then twisted and suture ligated at the level of the internal ring with a suture ligature of 2-0 Vicryl. The redundant sac was excised and discarded. The internal ring was tightened with a figure-of-eight suture of 2-0 Vicryl. The floor of the inguinal canal was repaired and reinforced with an onlay graft of UltraPro mesh. A 3" x 6" piece of mesh was used and was trimmed at the corners to accommodate the anatomy of the wound. The mesh was sutured in place with running sutures of 2-0 Prolene and interrupted mattress sutures  of 2-0 Prolene. The mesh was sutured so as to generously overlap the fascia at the pubic tubercle. I then sutured  the mesh along the inguinal ligament inferiorly. Medially, superiorly, and superiolaterally several other mattress sutures of 2-0 Prolene were placed. The mesh was incised laterally so as to wrap around the cord structures at the internal ring. Further sutures were placed laterally. This provided a very secure repair both medial and lateral to the internal ring but allowed an adequate fingertip opening for the cord structures. It should be mentioned that I sacrificed the ilioinguinal nerve lateral to the internal ring, clamping and dividing and ligating with a 2-0  silk tie. The wound was irrigated with saline. There was no bleeding. The external oblique was closed with a running suture of 2-0 Vicryl placing the cord structures deep to the external oblique. Scarpa's fascia was closed with 3-0 Vicryl sutures the skin closed with a running subcuticular suture of 4-0 Monocryl and Dermabond. The patient tolerated the procedure well and was taken to recovery in stable condition. EBL 10 cc. Counts correct. Complications none.     Angelia Mould. Derrell Lolling, M.D., FACS General and Minimally Invasive Surgery Breast and Colorectal Surgery  03/13/2012 2:45 PM

## 2012-03-13 NOTE — Transfer of Care (Signed)
Immediate Anesthesia Transfer of Care Note  Patient: ABDULMALIK DARCO  Procedure(s) Performed: Procedure(s): HERNIA REPAIR INGUINAL ADULT (Right) INSERTION OF MESH (Right)  Patient Location: PACU  Anesthesia Type:General  Level of Consciousness: awake, alert  and oriented  Airway & Oxygen Therapy: Patient Spontanous Breathing and Patient connected to nasal cannula oxygen  Post-op Assessment: Report given to PACU RN and Post -op Vital signs reviewed and stable  Post vital signs: Reviewed and stable  Complications: No apparent anesthesia complications

## 2012-03-13 NOTE — Interval H&P Note (Signed)
History and Physical Interval Note:  03/13/2012 1:12 PM  Richard [name]  has presented today for surgery, with the diagnosis of right inguinal hernia  The goals and the various methods of treatment have been discussed with the patient and family. After consideration of risks, benefits and other options for treatment, the patient has consented to  Procedure(s) with comments: HERNIA REPAIR INGUINAL ADULT (Right) - open right inguinal hernia repair with mesh INSERTION OF MESH (Right) as a surgical intervention .  The patient's history has been reviewed, patient examined today, no change in status, stable for surgery.  I have reviewed the patient's chart and labs.  Questions were answered to the patient's satisfaction.     Ernestene Mention

## 2012-03-13 NOTE — Preoperative (Signed)
Beta Blockers   Reason not to administer Beta Blockers:Not Applicable 

## 2012-03-14 ENCOUNTER — Encounter (HOSPITAL_COMMUNITY): Payer: Self-pay | Admitting: General Surgery

## 2012-03-14 LAB — GLUCOSE, CAPILLARY: Glucose-Capillary: 88 mg/dL (ref 70–99)

## 2012-03-14 MED ORDER — OXYCODONE-ACETAMINOPHEN 7.5-325 MG PO TABS: 1.0000 | ORAL_TABLET | ORAL | Status: DC | PRN

## 2012-03-14 NOTE — Progress Notes (Signed)
1 Day Post-Op  Subjective: Alert. Stable. Comfortable. Voiding without difficulty. No wound problems. Telemetry shows NSR at 67. Minimal PVCs. No cardiac or pulmonary symptoms.  Objective: Vital signs in last 24 hours: Temp:  [97.6 F (36.4 C)-98.4 F (36.9 C)] 98.4 F (36.9 C) (03/06 0423) Pulse Rate:  [45-66] 66 (03/06 0423) Resp:  [10-19] 18 (03/06 0423) BP: (135-167)/(59-71) 135/59 mmHg (03/06 0423) SpO2:  [94 %-100 %] 98 % (03/06 0423) Last BM Date: 02/13/12  Intake/Output from previous day: 03/05 0701 - 03/06 0700 In: 800 [I.V.:800] Out: 160 [Urine:150; Blood:10] Intake/Output this shift: Total I/O In: -  Out: 150 [Urine:150]  General appearance: aalert. Cooperative. Little status normal. No distress. Male genitalia: normal, right inguinal incision looks good. Minimal edema. No hematoma. Minimal tenderness. Penis scrotum and testes normal.  Lab Results:  Results for orders placed during the hospital encounter of 03/13/12 (from the past 24 hour(s))  GLUCOSE, CAPILLARY     Status: None   Collection Time    03/13/12 12:20 PM      Result Value Range   Glucose-Capillary 97  70 - 99 mg/dL  GLUCOSE, CAPILLARY     Status: Abnormal   Collection Time    03/13/12  2:56 PM      Result Value Range   Glucose-Capillary 103 (*) 70 - 99 mg/dL   Comment 1 Notify RN    GLUCOSE, CAPILLARY     Status: None   Collection Time    03/13/12  9:08 PM      Result Value Range   Glucose-Capillary 93  70 - 99 mg/dL  GLUCOSE, CAPILLARY     Status: None   Collection Time    03/14/12  5:55 AM      Result Value Range   Glucose-Capillary 88  70 - 99 mg/dL     Studies/Results: @RISRSLT24 @  . allopurinol  100 mg Oral Daily  . amLODipine  10 mg Oral Daily  . aspirin EC  81 mg Oral Daily  . atorvastatin  10 mg Oral Daily  . carvedilol  6.25 mg Oral BID WC  . heparin  5,000 Units Subcutaneous Q8H  . insulin aspart  0-15 Units Subcutaneous TID WC  . insulin glargine  5 Units Subcutaneous  QHS  . losartan  100 mg Oral Daily  . metformin  1,000 mg Oral Q breakfast  . spironolactone  25 mg Oral Daily     Assessment/Plan: s/p Procedure(s): HERNIA REPAIR INGUINAL ADULT INSERTION OF MESH  POD #1. Doing well. Meets discharge criteria Discharge home today Diet and activities discussed Return to office in 2 weeks. He states he has an appointment already scheduled March 17  @PROBHOSP @  LOS: 1 day    Richard [name],Richard [name] 03/14/2012  . .prob

## 2012-03-25 ENCOUNTER — Encounter (INDEPENDENT_AMBULATORY_CARE_PROVIDER_SITE_OTHER): Payer: Self-pay | Admitting: General Surgery

## 2012-03-25 ENCOUNTER — Ambulatory Visit (INDEPENDENT_AMBULATORY_CARE_PROVIDER_SITE_OTHER): Payer: Medicare Other | Admitting: General Surgery

## 2012-03-25 ENCOUNTER — Encounter (INDEPENDENT_AMBULATORY_CARE_PROVIDER_SITE_OTHER): Payer: Self-pay

## 2012-03-25 VITALS — BP 110/82 | HR 72 | Temp 97.1°F | Resp 12 | Ht 70.0 in | Wt 173.0 lb

## 2012-03-25 DIAGNOSIS — K4090 Unilateral inguinal hernia, without obstruction or gangrene, not specified as recurrent: Secondary | ICD-10-CM

## 2012-03-25 NOTE — Patient Instructions (Signed)
You are recovering from your right inguinal hernia surgery without any obvious surgical complications.  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  Return to see Dr. Derrell Lolling if further problems arise.

## 2012-03-25 NOTE — Progress Notes (Signed)
Patient ID: Richard [name], male   DOB: Oct 17, 1931, 79 y.o.   MRN: [id number] History: This gentleman underwent open repair right inguinal hernia with mesh on 03/13/2012. Surgery was uneventful. He states that he is doing well. Minimal discomfort. No wound problems. 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 There is no heavy lifting.  Exam: Patient looks well. In no distress. Right groin incision is healing uneventfully. Minimal swelling. No hematoma. No tenderness. Hernia repair intact. Penis,  scrotum and testes normal  Assessment: Right inguinal hernia, uneventful recovery following repair with mesh  Plan: Activities discussed. No sports or heavy lifting until after April 5. He may otherwise ambulate and walk up and down stairs without restriction. 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  return to see me if there are further problems.    Angelia Mould. Derrell Lolling, M.D., [facility] General and Minimally invasive Surgery Breast and Colorectal Surgery Office:   [phone] Pager:   [phone]

## 2012-03-29 ENCOUNTER — Encounter (INDEPENDENT_AMBULATORY_CARE_PROVIDER_SITE_OTHER): Payer: Self-pay

## 2012-03-29 ENCOUNTER — Telehealth (INDEPENDENT_AMBULATORY_CARE_PROVIDER_SITE_OTHER): Payer: Self-pay

## 2012-03-29 NOTE — Telephone Encounter (Signed)
The daughter called and states his work place won't let him come back to work with restrictions.  He needs a note to return to work after the 5 th with no restrictions.  I will do a letter and fax it to her at [phone]

## 2012-06-21 ENCOUNTER — Telehealth: Payer: Self-pay | Admitting: Cardiovascular Disease

## 2012-06-21 MED ORDER — LOSARTAN POTASSIUM 100 MG PO TABS: 100.0000 mg | ORAL_TABLET | Freq: Every day | ORAL | Status: DC

## 2012-06-21 NOTE — Telephone Encounter (Signed)
Sent refill to [facility]. Notified patient

## 2012-06-21 NOTE — Telephone Encounter (Signed)
Richard [name] states that Richard [name] is out of his Losartin 100mg .  Please fax refill to [facility].

## 2012-06-24 ENCOUNTER — Other Ambulatory Visit: Payer: Self-pay | Admitting: Internal Medicine

## 2012-06-24 DIAGNOSIS — M549 Dorsalgia, unspecified: Secondary | ICD-10-CM

## 2012-07-01 ENCOUNTER — Ambulatory Visit
Admission: RE | Admit: 2012-07-01 | Discharge: 2012-07-01 | Disposition: A | Discharge status: Home / Self Care | Payer: Medicare Other | Source: Ambulatory Visit | Attending: Internal Medicine | Admitting: Internal Medicine

## 2012-07-01 DIAGNOSIS — M549 Dorsalgia, unspecified: Secondary | ICD-10-CM

## 2013-05-22 ENCOUNTER — Other Ambulatory Visit: Payer: Self-pay | Admitting: *Deleted

## 2013-05-22 MED ORDER — CARVEDILOL 12.5 MG PO TABS: 6.2500 mg | ORAL_TABLET | Freq: Two times a day (BID) | ORAL | Status: DC

## 2013-05-22 MED ORDER — LOSARTAN POTASSIUM 100 MG PO TABS: 100.0000 mg | ORAL_TABLET | Freq: Every day | ORAL | Status: DC

## 2013-05-22 MED ORDER — AMLODIPINE BESYLATE 10 MG PO TABS: 10.0000 mg | ORAL_TABLET | Freq: Every day | ORAL | Status: DC

## 2013-05-22 NOTE — Telephone Encounter (Signed)
Rx was sent to pharmacy electronically. 

## 2013-08-29 ENCOUNTER — Other Ambulatory Visit: Payer: Self-pay | Admitting: *Deleted

## 2013-08-29 MED ORDER — AMLODIPINE BESYLATE 10 MG PO TABS: 10.0000 mg | ORAL_TABLET | Freq: Every day | ORAL | Status: DC

## 2013-09-19 ENCOUNTER — Other Ambulatory Visit: Payer: Self-pay | Admitting: *Deleted

## 2013-09-19 MED ORDER — AMLODIPINE BESYLATE 10 MG PO TABS: 10.0000 mg | ORAL_TABLET | Freq: Every day | ORAL | Status: DC

## 2013-09-19 MED ORDER — CARVEDILOL 12.5 MG PO TABS: 6.2500 mg | ORAL_TABLET | Freq: Two times a day (BID) | ORAL | Status: DC

## 2013-09-19 NOTE — Telephone Encounter (Signed)
Rx refill sent to patient pharmacy   

## 2013-10-27 ENCOUNTER — Encounter: Payer: Self-pay | Admitting: Cardiovascular Disease

## 2013-10-27 ENCOUNTER — Ambulatory Visit (INDEPENDENT_AMBULATORY_CARE_PROVIDER_SITE_OTHER): Payer: Medicare Other | Admitting: Cardiovascular Disease

## 2013-10-27 VITALS — BP 116/68 | HR 62 | Resp 16 | Ht 70.0 in | Wt 188.4 lb

## 2013-10-27 DIAGNOSIS — M069 Rheumatoid arthritis, unspecified: Secondary | ICD-10-CM

## 2013-10-27 DIAGNOSIS — I2510 Atherosclerotic heart disease of native coronary artery without angina pectoris: Secondary | ICD-10-CM

## 2013-10-27 DIAGNOSIS — I10 Essential (primary) hypertension: Secondary | ICD-10-CM

## 2013-10-27 DIAGNOSIS — E7800 Pure hypercholesterolemia, unspecified: Secondary | ICD-10-CM

## 2013-10-27 DIAGNOSIS — E782 Mixed hyperlipidemia: Secondary | ICD-10-CM

## 2013-10-27 NOTE — Patient Instructions (Signed)
Make sure you are NOT taking Atorvastatin and Pravastatin.  Only take Pravastatin at bedtime.  Dr. Sallyanne Kuster recommends that you schedule a follow-up appointment in: One year.

## 2013-10-29 ENCOUNTER — Encounter: Payer: Self-pay | Admitting: Cardiovascular Disease

## 2013-10-29 DIAGNOSIS — I10 Essential (primary) hypertension: Secondary | ICD-10-CM | POA: Insufficient documentation

## 2013-10-29 DIAGNOSIS — E785 Hyperlipidemia, unspecified: Secondary | ICD-10-CM | POA: Insufficient documentation

## 2013-10-29 DIAGNOSIS — M069 Rheumatoid arthritis, unspecified: Secondary | ICD-10-CM | POA: Insufficient documentation

## 2013-10-29 DIAGNOSIS — Z951 Presence of aortocoronary bypass graft: Secondary | ICD-10-CM | POA: Insufficient documentation

## 2013-10-29 NOTE — Progress Notes (Signed)
Patient ID: Richard SCHILLER, male   DOB: 10/08/1931, 80 y.o.   MRN: [id number]     Reason for office visit CAD, hypertension, diastolic dysfunction  Mr. Coppa has had no serious changes in his health since his last appointment in 2014. Most of his complaints are related to rheumatoid arthritis for which he is taking methotrexate. He has a remote history of bypass surgery in 2003 (Dr. Cyndia Bent: LIMA to LAD, SVG to OM, SVG sequentially to 2 branches of the ramus intermedius, SVG to RCA) and has normal left ventricular systolic function. His last functional study was a nuclear stress test in 2013 which showed diaphragmatic attenuation artifact but was otherwise normal. He has treated dyslipidemia on statin (his list actually contains both atorvastatin and pravastatin) and recently had favorable lipid profile (checked by Dr. Maudie Mercury). He has well treated systemic hypertension. He is on metformin monotherapy for diabetes mellitus and tells me that glycemic control has been good. He has not had any falls since his appointment last year.  No Known Allergies   Current Outpatient Prescriptions  Medication Sig Dispense Refill  . allopurinol (ZYLOPRIM) 100 MG tablet Take 100 mg by mouth Daily.       Marland Kitchen amLODipine (NORVASC) 10 MG tablet Take 1 tablet (10 mg total) by mouth daily. *APPOINTMENT MUST BE KEPT FOR FURTHER REFILLS*  90 tablet  0  . aspirin EC 81 MG tablet Take 81 mg by mouth daily.      . carvedilol (COREG) 12.5 MG tablet Take 0.5 tablets (6.25 mg total) by mouth 2 (two) times daily with a meal. *APPOINTMENT MUST BE KEPT FOR ADDITIONAL REFILLS*  90 tablet  0  . Cholecalciferol (VITAMIN D3) 1000 UNITS CAPS Take 1,000 Units by mouth daily.      Marland Kitchen losartan (COZAAR) 100 MG tablet Take 1 tablet (100 mg total) by mouth daily. <please make appointment>  90 tablet  0  . meclizine (ANTIVERT) 25 MG tablet Take 25 mg by mouth every 6 (six) hours as needed for dizziness.       . metformin (FORTAMET) 1000 MG (OSM) 24 hr  tablet Take 1,000 mg by mouth daily with breakfast.      . methotrexate (RHEUMATREX) 2.5 MG tablet       . oxyCODONE-acetaminophen (PERCOCET) 7.5-325 MG per tablet Take 1 tablet by mouth every 4 (four) hours as needed for pain.  30 tablet  0  . pravastatin (PRAVACHOL) 20 MG tablet       . spironolactone (ALDACTONE) 25 MG tablet Take 25 mg by mouth Daily.        No current facility-administered medications for this visit.    Past Medical History  Diagnosis Date  . Right inguinal hernia   . Insomnia   . Hypertension     systemic  . Diabetes mellitus without complication     type 2  . Hyperlipidemia   . CAD (coronary artery disease)     s/p MI in 2003  . Myocardial infarction 2003  . Osteoarthritis   . Microalbuminuria   . Vitamin D deficiency   . Mitral valve prolapse     borderline  . Mild mitral regurgitation by prior echocardiogram 02/10/09  . Mild diastolic dysfunction     slightly dilated left atrium  . Mild aortic insufficiency   . Barrett esophagus     history of  . Tubular adenoma 2011    on colonoscopy   . Diverticulosis     Past Surgical History  Procedure  Laterality Date  . Cataract extraction  12/2007, 01/16/2008    right in 2009, left in 2010  . Appendectomy    . Eye surgery      Dr. Tommi Emery  . Tibia fracture surgery      steel plate insertion  . Total knee arthroplasty      left  . Penile prosthesis implant  10/10/2000  . Esophagogastroduodenoscopy  09/13/2005    stricture and dilation  . Cardiac surgery  2005    quadruple bypass  . Coronary artery bypass graft  2003    05  . Tonsillectomy    . Inguinal hernia repair Right 03/13/2012    Procedure: HERNIA REPAIR INGUINAL ADULT;  Surgeon: Adin Hector, MD;  Location: [facility];  Service: General;  Laterality: Right;  . Insertion of mesh Right 03/13/2012    Procedure: INSERTION OF MESH;  Surgeon: Adin Hector, MD;  Location: [place];  Service: General;  Laterality: Right;  . Hernia repair    .  Nuclear stress test  03/2011    diaphragmatic attenuation artifact No ischemia    Family History  Problem Relation Age of Onset  . Alzheimer's disease Mother   . Dementia Mother 104  . Cancer Father     lung, metastatic to brain    History   Social History  . Marital Status: Married    Spouse Name: N/A    Number of Children: N/A  . Years of Education: N/A   Occupational History  . Not on file.   Social History Main Topics  . Smoking status: Former Smoker -- 1.00 packs/day for 50 years    Types: Cigarettes    Quit date: 02/08/2000  . Smokeless tobacco: Not on file  . Alcohol Use: No  . Drug Use: No  . Sexual Activity: Not on file   Other Topics Concern  . Not on file   Social History Narrative  . No narrative on file   ROS The patient specifically denies any chest pain at rest or with exertion, dyspnea at rest or with exertion, orthopnea, paroxysmal nocturnal dyspnea, syncope, palpitations, focal neurological deficits, intermittent claudication, lower extremity edema, unexplained weight gain, cough, hemoptysis or wheezing.  The patient also denies abdominal pain, nausea, vomiting, dysphagia, diarrhea, constipation, polyuria, polydipsia, dysuria, hematuria, frequency, urgency, abnormal bleeding or bruising, fever, chills, unexpected weight changes, mood swings, change in skin or hair texture, change in voice quality, auditory or visual problems, allergic reactions or rashes, new musculoskeletal complaints other than usual "aches and pains".   PHYSICAL EXAM BP 116/68  Pulse 62  Resp 16  Ht 5\' 10"  (1.778 m)  Wt 85.458 kg (188 lb 6.4 oz)  BMI 27.03 kg/m2  General: Alert, oriented x3, no distress Head: no evidence of trauma, PERRL, EOMI, no exophtalmos or lid lag, no myxedema, no xanthelasma; normal ears, nose and oropharynx Neck: normal jugular venous pulsations and no hepatojugular reflux; brisk carotid pulses without delay and no carotid bruits Chest: clear to  auscultation, no signs of consolidation by percussion or palpation, normal fremitus, symmetrical and full respiratory excursions, healed sternotomy scar with keloid formation Cardiovascular: normal position and quality of the apical impulse, regular rhythm, normal first and second heart sounds, no murmurs, rubs or gallops Abdomen: no tenderness or distention, no masses by palpation, no abnormal pulsatility or arterial bruits, normal bowel sounds, no hepatosplenomegaly Extremities: no clubbing, cyanosis or edema; 2+ radial, ulnar and brachial pulses bilaterally; 2+ right femoral, posterior tibial and dorsalis pedis  pulses; 2+ left femoral, posterior tibial and dorsalis pedis pulses; no subclavian or femoral bruits Neurological: grossly nonfocal   EKG: NSR, LBBB  Lipid Panel  No results found for this basename: chol, trig, hdl, cholhdl, vldl, ldlcalc, ldldirect    BMET    Component Value Date/Time   NA 139 03/06/2012 0920   K 4.2 03/06/2012 0920   CL 103 03/06/2012 0920   CO2 25 03/06/2012 0920   GLUCOSE 96 03/06/2012 0920   BUN 20 03/06/2012 0920   CREATININE 1.17 03/06/2012 0920   CALCIUM 10.2 03/06/2012 0920   GFRNONAA 58* 03/06/2012 0920   GFRAA 67* 03/06/2012 0920     ASSESSMENT AND PLAN  12 years after coronary bypass surgery Mr. Sans is asymptomatic and has well treated coronary risk factors. No changes appear necessary to his medications today. He is encouraged to remain physically active. Make sure that he is not taking both statins. Followup on a yearly basis.  Patient Instructions  Make sure you are NOT taking Atorvastatin and Pravastatin.  Only take Pravastatin at bedtime.  Dr. Sallyanne Kuster recommends that you schedule a follow-up appointment in: One year.      Orders Placed This Encounter  Procedures  . EKG 12-Lead   Meds ordered this encounter  Medications  . methotrexate (RHEUMATREX) 2.5 MG tablet    Sig:   . pravastatin (PRAVACHOL) 20 MG tablet    Sig:   .  Cholecalciferol (VITAMIN D3) 1000 UNITS CAPS    Sig: Take 1,000 Units by mouth daily.    Holli Humbles, MD, [place] [phone] office [phone] pager

## 2014-01-08 ENCOUNTER — Other Ambulatory Visit: Payer: Self-pay | Admitting: Cardiovascular Disease

## 2014-01-08 NOTE — Telephone Encounter (Signed)
E sent to pharm 

## 2014-04-24 ENCOUNTER — Telehealth: Payer: Self-pay | Admitting: Cardiovascular Disease

## 2014-04-24 MED ORDER — AMLODIPINE BESYLATE 10 MG PO TABS: 10.0000 mg | ORAL_TABLET | Freq: Every day | ORAL | Status: DC

## 2014-04-24 NOTE — Telephone Encounter (Signed)
E-sent medication to pharmacy  Vivien Rota aware

## 2014-04-24 NOTE — Telephone Encounter (Signed)
°  1. Which medications need to be refilled? Amlodipine-new prescription  2. Which pharmacy is medication to be sent to?[facility]  3. Do they need a 30 day or 90 day supply? 30  4. Would they like a call back once the medication has been sent to the pharmacy? yes

## 2014-05-18 ENCOUNTER — Other Ambulatory Visit: Payer: Self-pay | Admitting: Cardiovascular Disease

## 2014-09-29 ENCOUNTER — Other Ambulatory Visit: Payer: Self-pay | Admitting: Cardiovascular Disease

## 2014-09-29 NOTE — Telephone Encounter (Signed)
Rx request sent to pharmacy.  

## 2014-10-28 ENCOUNTER — Ambulatory Visit (INDEPENDENT_AMBULATORY_CARE_PROVIDER_SITE_OTHER): Payer: Medicare Other | Admitting: Cardiovascular Disease

## 2014-10-28 VITALS — BP 122/84 | HR 54 | Resp 16 | Ht 70.0 in | Wt 185.3 lb

## 2014-10-28 DIAGNOSIS — E7800 Pure hypercholesterolemia, unspecified: Secondary | ICD-10-CM

## 2014-10-28 DIAGNOSIS — I2510 Atherosclerotic heart disease of native coronary artery without angina pectoris: Secondary | ICD-10-CM

## 2014-10-28 DIAGNOSIS — I10 Essential (primary) hypertension: Secondary | ICD-10-CM

## 2014-10-28 MED ORDER — CARVEDILOL 6.25 MG PO TABS: 6.2500 mg | ORAL_TABLET | Freq: Two times a day (BID) | ORAL | Status: DC

## 2014-10-28 NOTE — Progress Notes (Signed)
Patient ID: Richard [name], male   DOB: [birth year], 80 y.o.   MRN: [id number]     Cardiology Office Note   Date:  10/29/2014   ID:  Richard [name], DOB 04-08-1931, MRN [id number]  PCP:  Jani Gravel, MD  Cardiologist:   Sanda Klein, MD   Chief Complaint  Patient presents with  . Annual Exam    pt c/o having some itching around the incision in his chest, also c/o getting dizzy, no swelling in legs and has some SOB      History of Present Illness: Richard [name] is a 81 y.o. male who presents for  Follow-up of coronary artery disease status post remote myocardial infarction in 2003 and CABG (Dr. Cyndia Bent: LIMA to LAD, SVG to OM, SVG sequentially to 2 branches of the ramus intermedius, SVG to RCA), remarkably without any new cardiac problems since that time.  He has treated hypertension and hyperlipidemia as well as rheumatoid arthritis and hyperuricemia.  In 2013 he had a low risk nuclear stress test with diaphragmatic attenuation artifact.  His only complaints center around persistent itching around a large keloid scar at his previous sternotomies site. He denies problems with exertional angina or dyspnea,  leg edema, focal neurological deficits are intermittent claudication.  He describes fatigue and dizziness if he tries to walk up stairs or long distances.   He is taking the full 12.5 mg tablet of carvedilol twice daily instead of the half dose that had been prescribed.  The medication bottle clearly states that he should be taking half tablet twice daily.    Past Medical History  Diagnosis Date  . Right inguinal hernia   . Insomnia   . Hypertension     systemic  . Diabetes mellitus without complication ([place])     type 2  . Hyperlipidemia   . CAD (coronary artery disease)     s/p MI in 2003  . Myocardial infarction ([place]) 2003  . Osteoarthritis   . Microalbuminuria   . Vitamin D deficiency   . Mitral valve prolapse     borderline  . Mild mitral regurgitation by prior  echocardiogram 02/10/09  . Mild diastolic dysfunction     slightly dilated left atrium  . Mild aortic insufficiency   . Barrett esophagus     history of  . Tubular adenoma 2011    on colonoscopy   . Diverticulosis     Past Surgical History  Procedure Laterality Date  . Cataract extraction  12/2007, 01/16/2008    right in 2009, left in 2010  . Appendectomy    . Eye surgery      Dr. Tommi Emery  . Tibia fracture surgery      steel plate insertion  . Total knee arthroplasty      left  . Penile prosthesis implant  10/10/2000  . Esophagogastroduodenoscopy  09/13/2005    stricture and dilation  . Cardiac surgery  2005    quadruple bypass  . Coronary artery bypass graft  2003    05  . Tonsillectomy    . Inguinal hernia repair Right 03/13/2012    Procedure: HERNIA REPAIR INGUINAL ADULT;  Surgeon: Adin Hector, MD;  Location: [facility];  Service: General;  Laterality: Right;  . Insertion of mesh Right 03/13/2012    Procedure: INSERTION OF MESH;  Surgeon: Adin Hector, MD;  Location: [facility];  Service: General;  Laterality: Right;  . Hernia repair    . Nuclear stress test  03/2011  diaphragmatic attenuation artifact No ischemia     Current Outpatient Prescriptions  Medication Sig Dispense Refill  . allopurinol (ZYLOPRIM) 100 MG tablet Take 100 mg by mouth Daily.     Marland Kitchen amLODipine (NORVASC) 10 MG tablet TAKE 1 TABLET BY MOUTH EVERY DAY 30 tablet 0  . aspirin EC 81 MG tablet Take 81 mg by mouth daily.    . Cholecalciferol (VITAMIN D3) 1000 UNITS CAPS Take 1,000 Units by mouth daily.    . metformin (FORTAMET) 1000 MG (OSM) 24 hr tablet Take 1,000 mg by mouth 2 (two) times daily with a meal.     . pravastatin (PRAVACHOL) 40 MG tablet Take 40 mg by mouth daily. Pt take1 tablet and a half tablet once daily  12  . spironolactone (ALDACTONE) 25 MG tablet Take 25 mg by mouth Daily.     . carvedilol (COREG) 6.25 MG tablet Take 1 tablet (6.25 mg total) by mouth 2 (two) times daily. 180 tablet 3    No current facility-administered medications for this visit.    Allergies:   Review of patient's allergies indicates no known allergies.    Social History:  The patient  reports that he quit smoking about 14 years ago. His smoking use included Cigarettes. He has a 50 pack-year smoking history. He does not have any smokeless tobacco history on file. He reports that he does not drink alcohol or use illicit drugs.   Family History:  The patient's family history includes Alzheimer's disease in his mother; Cancer in his father; Dementia (age of onset: 51) in his mother.    ROS:  Please see the history of present illness.    Otherwise, review of systems positive for none.   All other systems are reviewed and negative.    PHYSICAL EXAM: VS:  BP 122/84 mmHg  Pulse 54  Resp 16  Ht 5\' 10"  (1.778 m)  Wt 185 lb 4.8 oz (84.052 kg)  BMI 26.59 kg/m2 , BMI Body mass index is 26.59 kg/(m^2).  General: Alert, oriented x3, no distress Head: no evidence of trauma, PERRL, EOMI, no exophtalmos or lid lag, no myxedema, no xanthelasma; normal ears, nose and oropharynx Neck: normal jugular venous pulsations and no hepatojugular reflux; brisk carotid pulses without delay and no carotid bruits Chest: clear to auscultation, no signs of consolidation by percussion or palpation, normal fremitus, symmetrical and full respiratory excursions Cardiovascular: normal position and quality of the apical impulse, regular rhythm, normal first and second heart sounds, no murmurs, rubs or gallops.  Exuberant sternotomy keloid scar Abdomen: no tenderness or distention, no masses by palpation, no abnormal pulsatility or arterial bruits, normal bowel sounds, no hepatosplenomegaly Extremities: no clubbing, cyanosis or edema; 2+ radial, ulnar and brachial pulses bilaterally; 2+ right femoral, posterior tibial and dorsalis pedis pulses; 2+ left femoral, posterior tibial and dorsalis pedis pulses; no subclavian or femoral  bruits Neurological: grossly nonfocal Psych: euthymic mood, full affect   EKG:  EKG is ordered today. The ekg ordered today demonstrates   Sinus bradycardia, intraventricular conduction delay most closely resembling a left bundle branch block with QRS duration of 124 ms. His care is complex has become steadily brought her over the years and parallel there have been more and more prominent T-wave inversions in the inferior leads and V5 V6. T-wave inversion is asymmetrical, most suggestive of secondary repolarization abnormality rather than ischemia   Recent Labs:  from Dr. Maudie Mercury June 2016   Hemoglobin 12.8, creatinine 1.13 , normal electrolytes and LFTs, hemoglobin  A1c 5.8%  Total cholesterol 145, HDL 41, LDL 82, triglycerides 112  Wt Readings from Last 3 Encounters:  10/28/14 185 lb 4.8 oz (84.052 kg)  10/27/13 188 lb 6.4 oz (85.458 kg)  03/25/12 173 lb (78.472 kg)    .   ASSESSMENT AND PLAN:  1.  Sinus bradycardia and worsening intraventricular conduction delay. He is taking twice the prescribed dose of carvedilol even though his bottle of medications shows the correct dose. I have sent in a new prescription for carvedilol 6.25 mg tablets to be taken twice daily. Will have to monitor for persistent bradycardia or further worsening of AV conduction or intraventricular conduction, which will lead to further reduction in the dose of beta blocker.  2. Essential hypertension well controlled  3.  Hyperlipidemia on statin therapy  4.  Coronary artery disease status post remote CABG without angina pectoris    Current medicines are reviewed at length with the patient today.  The patient does not have concerns regarding medicines.  The following changes have been made:   Take carvedilol 6.25 mg twice a day  Labs/ tests ordered today include:   Orders Placed This Encounter  Procedures  . EKG 12-Lead    Patient Instructions  THROW AWAY THE BOTTLE OF CARVEDILOL 12.5MG   TABLETS.   START CARVEDILOL 6.25 MG TWICE A DAY - THIS RX HAS BEEN SENT TO YOUR PHARMACY.  Dr. Sallyanne Kuster recommends that you schedule a follow-up appointment in: ONE YEAR.        Mikael Spray, MD  10/29/2014 6:03 PM    Sanda Klein, MD, [facility] [phone] office [phone] pager

## 2014-10-28 NOTE — Patient Instructions (Signed)
THROW AWAY THE BOTTLE OF CARVEDILOL 12.5MG  TABLETS.   START CARVEDILOL 6.25 MG TWICE A DAY - THIS RX HAS BEEN SENT TO YOUR PHARMACY.  Dr. Sallyanne Kuster recommends that you schedule a follow-up appointment in: ONE YEAR.

## 2014-10-29 ENCOUNTER — Encounter: Payer: Self-pay | Admitting: Cardiovascular Disease

## 2014-10-31 ENCOUNTER — Other Ambulatory Visit: Payer: Self-pay | Admitting: Cardiovascular Disease

## 2015-02-01 ENCOUNTER — Telehealth: Payer: Self-pay | Admitting: Cardiovascular Disease

## 2015-02-01 NOTE — Telephone Encounter (Signed)
Toni(daughter) is calling because , Richard [name] is saying that the incision where he had bypass surgery is very painful to the touch and it has been about 65yrs and want to know is this normal . Please call   Thanks

## 2015-02-01 NOTE — Telephone Encounter (Signed)
Spoke with toni, the pt is c/o tenderness at his scar from CABG. Spoke with pt, he reports itching and tenderness of the scar since surgery.(2003). He reports it is painful when he scratches the area. Advised to try lotion or hydrocortisone cream to the area. If cont to bother him, he could see a dermatologist.

## 2015-07-05 DIAGNOSIS — Z125 Encounter for screening for malignant neoplasm of prostate: Secondary | ICD-10-CM | POA: Diagnosis not present

## 2015-07-05 DIAGNOSIS — E119 Type 2 diabetes mellitus without complications: Secondary | ICD-10-CM | POA: Diagnosis not present

## 2015-07-05 DIAGNOSIS — I2510 Atherosclerotic heart disease of native coronary artery without angina pectoris: Secondary | ICD-10-CM | POA: Diagnosis not present

## 2015-07-14 DIAGNOSIS — E119 Type 2 diabetes mellitus without complications: Secondary | ICD-10-CM | POA: Diagnosis not present

## 2015-07-15 DIAGNOSIS — E7800 Pure hypercholesterolemia, unspecified: Secondary | ICD-10-CM | POA: Diagnosis not present

## 2015-07-15 DIAGNOSIS — I10 Essential (primary) hypertension: Secondary | ICD-10-CM | POA: Diagnosis not present

## 2015-07-15 DIAGNOSIS — Z23 Encounter for immunization: Secondary | ICD-10-CM | POA: Diagnosis not present

## 2015-07-15 DIAGNOSIS — R972 Elevated prostate specific antigen [PSA]: Secondary | ICD-10-CM | POA: Diagnosis not present

## 2015-07-15 DIAGNOSIS — E119 Type 2 diabetes mellitus without complications: Secondary | ICD-10-CM | POA: Diagnosis not present

## 2015-11-01 DIAGNOSIS — N400 Enlarged prostate without lower urinary tract symptoms: Secondary | ICD-10-CM | POA: Diagnosis not present

## 2015-11-04 ENCOUNTER — Encounter: Payer: Self-pay | Admitting: Cardiovascular Disease

## 2015-11-04 ENCOUNTER — Ambulatory Visit (INDEPENDENT_AMBULATORY_CARE_PROVIDER_SITE_OTHER): Payer: Medicare Other | Admitting: Cardiovascular Disease

## 2015-11-04 VITALS — BP 124/60 | HR 52 | Ht 69.0 in | Wt 176.2 lb

## 2015-11-04 DIAGNOSIS — I2510 Atherosclerotic heart disease of native coronary artery without angina pectoris: Secondary | ICD-10-CM

## 2015-11-04 DIAGNOSIS — E7800 Pure hypercholesterolemia, unspecified: Secondary | ICD-10-CM | POA: Diagnosis not present

## 2015-11-04 DIAGNOSIS — R001 Bradycardia, unspecified: Secondary | ICD-10-CM | POA: Diagnosis not present

## 2015-11-04 DIAGNOSIS — I10 Essential (primary) hypertension: Secondary | ICD-10-CM

## 2015-11-04 DIAGNOSIS — E1159 Type 2 diabetes mellitus with other circulatory complications: Secondary | ICD-10-CM | POA: Diagnosis not present

## 2015-11-04 DIAGNOSIS — E119 Type 2 diabetes mellitus without complications: Secondary | ICD-10-CM | POA: Insufficient documentation

## 2015-11-04 MED ORDER — CARVEDILOL 3.125 MG PO TABS: 3.1250 mg | ORAL_TABLET | Freq: Two times a day (BID) | ORAL | 3 refills | Status: AC

## 2015-11-04 NOTE — Patient Instructions (Signed)
Dr Sallyanne Kuster has recommended making the following medication changes: 1. DECREASE Carvedilol to 3.125 mg twice daily  Your physician recommends that you schedule a follow-up appointment in 1 year. You will receive a reminder letter in the mail two months in advance. If you don't receive a letter, please call our office to schedule the follow-up appointment.  If you need a refill on your cardiac medications before your next appointment, please call your pharmacy.

## 2015-11-04 NOTE — Progress Notes (Signed)
Cardiology Office Note    Date:  11/04/2015   ID:  Richard [name], DOB 1931/11/14, MRN [id number]  PCP:  Jani Gravel, MD  Cardiologist:   Sanda Klein, MD   Chief Complaint  Patient presents with  . Follow-up  . Chest Pain    pt states every once in a while     History of Present Illness:  Richard [name] is a 82 y.o. male with history of coronary disease with a myocardial infarction 2003 followed by multivessel bypass surgery (Dr. Cyndia Bent: LIMA to LAD, SVG to OM, sequential SVG to branches of ramus intermedius, SVG to RCA), with preserved left ventricular systolic function and mild hyperlipidemia, hypertension and order Lyme diabetes mellitus. He also has hyperuricemia and rheumatoid arthritis. His last functional study was a low risk nuclear stress test in 2013 that described diaphragmatic attenuation artifact and normal left ventricular systolic function.  He is moving a little slower as he has become older but denies any problems with exertional angina or dyspnea. He has not had dizziness, syncope or falls. He remains independent in personal care but is helped by his daughter.  The patient specifically denies any chest pain at rest exertion, dyspnea at rest or with exertion, orthopnea, paroxysmal nocturnal dyspnea, syncope, palpitations, focal neurological deficits, intermittent claudication, lower extremity edema, unexplained weight gain, cough, hemoptysis or wheezing.   Past Medical History:  Diagnosis Date  . Barrett esophagus    history of  . CAD (coronary artery disease)    s/p MI in 2003  . Diabetes mellitus without complication ([place])    type 2  . Diverticulosis   . Hyperlipidemia   . Hypertension    systemic  . Insomnia   . Microalbuminuria   . Mild aortic insufficiency   . Mild diastolic dysfunction    slightly dilated left atrium  . Mild mitral regurgitation by prior echocardiogram 02/10/09  . Mitral valve prolapse    borderline  . Myocardial infarction 2003  .  Osteoarthritis   . Right inguinal hernia   . Tubular adenoma 2011   on colonoscopy   . Vitamin D deficiency     Past Surgical History:  Procedure Laterality Date  . APPENDECTOMY    . CARDIAC SURGERY  2005   quadruple bypass  . CATARACT EXTRACTION  12/2007, 01/16/2008   right in 2009, left in 2010  . CORONARY ARTERY BYPASS GRAFT  2003   05  . ESOPHAGOGASTRODUODENOSCOPY  09/13/2005   stricture and dilation  . EYE SURGERY     Dr. Tommi Emery  . HERNIA REPAIR    . INGUINAL HERNIA REPAIR Right 03/13/2012   Procedure: HERNIA REPAIR INGUINAL ADULT;  Surgeon: Adin Hector, MD;  Location: [facility];  Service: General;  Laterality: Right;  . INSERTION OF MESH Right 03/13/2012   Procedure: INSERTION OF MESH;  Surgeon: Adin Hector, MD;  Location: [facility];  Service: General;  Laterality: Right;  . Nuclear stress test  03/2011   diaphragmatic attenuation artifact No ischemia  . PENILE PROSTHESIS IMPLANT  10/10/2000  . TIBIA FRACTURE SURGERY     steel plate insertion  . TONSILLECTOMY    . TOTAL KNEE ARTHROPLASTY     left    Current Medications: Outpatient Medications Prior to Visit  Medication Sig Dispense Refill  . allopurinol (ZYLOPRIM) 100 MG tablet Take 100 mg by mouth Daily.     Marland Kitchen amLODipine (NORVASC) 10 MG tablet TAKE 1 TABLET BY MOUTH EVERY DAY 30 tablet 11  .  aspirin EC 81 MG tablet Take 81 mg by mouth daily.    . Cholecalciferol (VITAMIN D3) 1000 UNITS CAPS Take 1,000 Units by mouth daily.    . metformin (FORTAMET) 1000 MG (OSM) 24 hr tablet Take 1,000 mg by mouth 2 (two) times daily with a meal.     . pravastatin (PRAVACHOL) 40 MG tablet Take 40 mg by mouth daily. Pt take1 tablet and a half tablet once daily  12  . spironolactone (ALDACTONE) 25 MG tablet Take 25 mg by mouth Daily.     . carvedilol (COREG) 6.25 MG tablet Take 1 tablet (6.25 mg total) by mouth 2 (two) times daily. 180 tablet 3   No facility-administered medications prior to visit.      Allergies:   Review of  patient's allergies indicates no known allergies.   Social History   Social History  . Marital status: Married    Spouse name: N/A  . Number of children: N/A  . Years of education: N/A   Social History Main Topics  . Smoking status: Former Smoker    Packs/day: 1.00    Years: 50.00    Types: Cigarettes    Quit date: 02/08/2000  . Smokeless tobacco: None  . Alcohol use No  . Drug use: No  . Sexual activity: Not Asked   Other Topics Concern  . None   Social History Narrative  . None     Family History:  The patient's family history includes Alzheimer's disease in his mother; Cancer in his father; Dementia (age of onset: 53) in his mother.   ROS:   Please see the history of present illness.    ROS All other systems reviewed and are negative.   PHYSICAL EXAM:   VS:  BP 124/60   Pulse (!) 52   Ht 5\' 9"  (1.753 m)   Wt 176 lb 3.2 oz (79.9 kg)   BMI 26.02 kg/m    GEN: Well nourished, well developed, in no acute distress  HEENT: normal  Neck: no JVD, carotid bruits, or masses Cardiac: RRR; no murmurs, rubs, or gallops,no edema  Respiratory:  clear to auscultation bilaterally, normal work of breathing GI: soft, nontender, nondistended, + BS MS: no deformity or atrophy  Skin: warm and dry, no rash Neuro:  Alert and Oriented x 3, Strength and sensation are intact Psych: euthymic mood, full affect  Wt Readings from Last 3 Encounters:  11/04/15 176 lb 3.2 oz (79.9 kg)  10/28/14 185 lb 4.8 oz (84.1 kg)  10/27/13 188 lb 6.4 oz (85.5 kg)      Studies/Labs Reviewed:   EKG:  EKG is ordered today.  The ekg ordered today demonstrates Sinus bradycardia, nonspecific intraventricular conduction delay most closely resembling a left bundle branch block with a QRS duration of 126 ms and secondary repolarization changes in inferior and lateral leads, similar to last year's tracing. QTC 407 ms  Recent Labs: December 2016 hemoglobin 13.4, normal LFTs, creatinine 0.99, potassium 4.4,  glucose 103, hemoglobin A1c 5.9% Lipid Panel December 2016 total cholesterol 129, HDL 39, LDL 73, triglycerides 85    ASSESSMENT:    1. Coronary artery disease involving native coronary artery of native heart without angina pectoris   2. Sinus bradycardia   3. Hypercholesteremia   4. Type 2 diabetes mellitus with other circulatory complication, without long-term current use of insulin ([place])   5. Essential hypertension      PLAN:  In order of problems listed above:  1. CAD: Asymptomatic almost  15 years after bypass surgery 2. Bradycardia: We have had to gradually reduce his dose of beta blocker as he has aged and has developed both slower sinus rate and worsening intraventricular conduction delay. Will decrease the carvedilol further to 3.125 mg twice a day. 3. HLP: With the exception of a rather low HDL cholesterol, all other lipid parameters are at goal 4. DM: Excellent control on metformin monotherapy 5. HTN: Excellent blood pressure control. I doubt that the slight decrease in carvedilol dose will lead to need for adjustment of his other medications.    Medication Adjustments/Labs and Tests Ordered: Current medicines are reviewed at length with the patient today.  Concerns regarding medicines are outlined above.  Medication changes, Labs and Tests ordered today are listed in the Patient Instructions below. Patient Instructions  Dr Sallyanne Kuster has recommended making the following medication changes: 1. DECREASE Carvedilol to 3.125 mg twice daily  Your physician recommends that you schedule a follow-up appointment in 1 year. You will receive a reminder letter in the mail two months in advance. If you don't receive a letter, please call our office to schedule the follow-up appointment.  If you need a refill on your cardiac medications before your next appointment, please call your pharmacy.    Signed, Sanda Klein, MD  11/04/2015 8:47 AM    [facility], [place], [place]  [address] Phone: [phone]; Fax: [phone]

## 2015-11-05 ENCOUNTER — Other Ambulatory Visit: Payer: Self-pay | Admitting: Cardiovascular Disease

## 2015-11-08 DIAGNOSIS — I10 Essential (primary) hypertension: Secondary | ICD-10-CM | POA: Diagnosis not present

## 2015-11-08 DIAGNOSIS — E119 Type 2 diabetes mellitus without complications: Secondary | ICD-10-CM | POA: Diagnosis not present

## 2015-11-08 NOTE — Telephone Encounter (Signed)
Rx(s) sent to pharmacy electronically.  

## 2015-11-15 DIAGNOSIS — Z0000 Encounter for general adult medical examination without abnormal findings: Secondary | ICD-10-CM | POA: Diagnosis not present

## 2015-11-15 DIAGNOSIS — E119 Type 2 diabetes mellitus without complications: Secondary | ICD-10-CM | POA: Diagnosis not present

## 2015-11-15 DIAGNOSIS — N400 Enlarged prostate without lower urinary tract symptoms: Secondary | ICD-10-CM | POA: Diagnosis not present

## 2015-11-15 DIAGNOSIS — R972 Elevated prostate specific antigen [PSA]: Secondary | ICD-10-CM | POA: Diagnosis not present

## 2015-12-17 DIAGNOSIS — N400 Enlarged prostate without lower urinary tract symptoms: Secondary | ICD-10-CM | POA: Diagnosis not present

## 2015-12-24 DIAGNOSIS — D649 Anemia, unspecified: Secondary | ICD-10-CM | POA: Diagnosis not present

## 2015-12-24 DIAGNOSIS — I10 Essential (primary) hypertension: Secondary | ICD-10-CM | POA: Diagnosis not present

## 2015-12-24 DIAGNOSIS — E7800 Pure hypercholesterolemia, unspecified: Secondary | ICD-10-CM | POA: Diagnosis not present

## 2015-12-24 DIAGNOSIS — M549 Dorsalgia, unspecified: Secondary | ICD-10-CM | POA: Diagnosis not present

## 2016-01-11 DIAGNOSIS — E1142 Type 2 diabetes mellitus with diabetic polyneuropathy: Secondary | ICD-10-CM | POA: Diagnosis not present

## 2016-02-17 DIAGNOSIS — Z79899 Other long term (current) drug therapy: Secondary | ICD-10-CM | POA: Diagnosis not present

## 2016-02-17 DIAGNOSIS — E119 Type 2 diabetes mellitus without complications: Secondary | ICD-10-CM | POA: Diagnosis not present

## 2016-02-17 DIAGNOSIS — I10 Essential (primary) hypertension: Secondary | ICD-10-CM | POA: Diagnosis not present

## 2016-02-17 DIAGNOSIS — D649 Anemia, unspecified: Secondary | ICD-10-CM | POA: Diagnosis not present

## 2016-02-24 DIAGNOSIS — I10 Essential (primary) hypertension: Secondary | ICD-10-CM | POA: Diagnosis not present

## 2016-02-24 DIAGNOSIS — E1142 Type 2 diabetes mellitus with diabetic polyneuropathy: Secondary | ICD-10-CM | POA: Diagnosis not present

## 2016-02-24 DIAGNOSIS — E7800 Pure hypercholesterolemia, unspecified: Secondary | ICD-10-CM | POA: Diagnosis not present

## 2016-02-24 DIAGNOSIS — D649 Anemia, unspecified: Secondary | ICD-10-CM | POA: Diagnosis not present

## 2016-03-20 ENCOUNTER — Telehealth: Payer: Self-pay | Admitting: Cardiovascular Disease

## 2016-03-20 MED ORDER — ISOSORBIDE MONONITRATE ER 30 MG PO TB24: 30.0000 mg | ORAL_TABLET | Freq: Every day | ORAL | 5 refills | Status: DC

## 2016-03-20 MED ORDER — NITROGLYCERIN 0.4 MG SL SUBL: 0.4000 mg | SUBLINGUAL_TABLET | SUBLINGUAL | 2 refills | Status: AC | PRN

## 2016-03-20 NOTE — Telephone Encounter (Signed)
Contacted Vivien Rota (daughter) and communicated MD advice/med changes. She voiced understanding and will notify her father. Aware a scheduler will contact her to arrange an appt w/Dr C or APP  Rx(s) sent to pharmacy electronically.  Staff message sent to scheduling pool to contact patient to arrange appt.

## 2016-03-20 NOTE — Telephone Encounter (Signed)
Returned call to Richard [name] (daughter) She is not currently w/patient She states patient told her that his chest was hurting around his heart and over to the side Per daughter, this has been going on about 1 week, no PRN medications taken, no SOB, diaphoresis, nausea, dizziness -- h/o MI + CABG in 2003  Advised daughter I would contact patient for more info on his symptoms  Contacted patient - hard of hearing - NO current chest pain Patient states he has "deeper pain" once in a while - pain lasts a few minutes He states this happens on the left and right side of chest The pain eases off on its on He states this has been going on about 1 week Not correlated w/eating or movement or rest He states he has not experienced this pain before - its new He states his chest itches  Meds verified - no changes  Patient requested when we contact them w/advice from MD to call his daughter Richard [name] routed to Dr. Sallyanne Kuster

## 2016-03-20 NOTE — Telephone Encounter (Signed)
Pt c/o of Chest Pain: STAT if CP now or developed within 24 hours  1. Are you having CP right now? unknown  2. Are you experiencing any other symptoms (ex. SOB, nausea, vomiting, sweating)? no  3. How long have you been experiencing CP? week 4. Is your CP continuous or coming and going?  coming and going 5. Have you taken Nitroglycerin? no ?

## 2016-03-20 NOTE — Telephone Encounter (Signed)
Needs office appt as soon as possible, APP if mecessary. Start isosorbide mononitrate 30 mg daily. If chest pain last > 20-30 minutes, go to ED. Make sure he has SL NTG tabs < 81 year old and knows how to use them. Thanks [employer]

## 2016-03-21 ENCOUNTER — Ambulatory Visit: Payer: Medicare Other | Admitting: Cardiology

## 2016-03-22 ENCOUNTER — Ambulatory Visit (INDEPENDENT_AMBULATORY_CARE_PROVIDER_SITE_OTHER): Payer: Medicare Other | Admitting: Cardiology

## 2016-03-22 ENCOUNTER — Encounter: Payer: Self-pay | Admitting: Cardiology

## 2016-03-22 VITALS — BP 138/70 | HR 61 | Ht 69.0 in | Wt 181.0 lb

## 2016-03-22 DIAGNOSIS — E7800 Pure hypercholesterolemia, unspecified: Secondary | ICD-10-CM | POA: Diagnosis not present

## 2016-03-22 DIAGNOSIS — I209 Angina pectoris, unspecified: Secondary | ICD-10-CM | POA: Diagnosis not present

## 2016-03-22 DIAGNOSIS — E1159 Type 2 diabetes mellitus with other circulatory complications: Secondary | ICD-10-CM | POA: Diagnosis not present

## 2016-03-22 DIAGNOSIS — R001 Bradycardia, unspecified: Secondary | ICD-10-CM | POA: Diagnosis not present

## 2016-03-22 DIAGNOSIS — I2510 Atherosclerotic heart disease of native coronary artery without angina pectoris: Secondary | ICD-10-CM

## 2016-03-22 NOTE — Patient Instructions (Addendum)
Medication Instructions:  Your physician recommends that you continue on your current medications as directed. Please refer to the Current Medication list given to you today.   Labwork: None ordered  Testing/Procedures: Your physician has requested that you have a lexiscan myoview. For further information please visit [email or web address]. Please follow instruction sheet, as given.    Follow-Up: Follow up with Dr. Orene Desanctis after stress test.  Any Other Special Instructions Will Be Listed Below (If Applicable).     If you need a refill on your cardiac medications before your next appointment, please call your pharmacy.

## 2016-03-22 NOTE — Progress Notes (Signed)
Cardiology Office Note   Date:  03/22/2016   ID:  NOTNAMED SCHOLZ, DOB 10-18-1931, MRN [id number]  PCP:  Jani Gravel, MD  Cardiologist:  Dr. Bertrum Sol     Chief Complaint  Patient presents with  . Chest Pain      History of Present Illness: Richard [name] is a 83 y.o. male who presents for chest pain.  He called on the 12th with a week of chest pain.  No associated symptoms.  Dr. Jerilynn Mages. Croitoru added imdur 30 mg daily and SL NTG tabs.   He is here for further eval.   He has a hx of CAD with a myocardial infarction 2003 followed by multivessel bypass surgery (Dr. Cyndia Bent: LIMA to LAD, SVG to OM, sequential SVG to branches of ramus intermedius, SVG to RCA), with preserved left ventricular systolic function and mild hyperlipidemia, hypertension and order Lyme diabetes mellitus. He also has hyperuricemia and rheumatoid arthritis. His last functional study was a low risk nuclear stress test in 2013 that described diaphragmatic attenuation artifact and normal left ventricular systolic function.  His overall HR has gradually slowed and BB has been reduced. Also with worsening intrventricular conduction delay. His HLD has been controlled, as well as DM and HTN.   His renal function has been stable.  Echo 2011 with EF > 55% mod LA dilation and mild MR.  Today since starting the imdur he has had another episode of chest pain.  When they occur they do not last long.  No associated symptoms, he is a poor historian as well.  Chest pain for about a week.  Off and on, occurs with rest or activity but has not awakened from sleep.   Past Medical History:  Diagnosis Date  . Barrett esophagus    history of  . CAD (coronary artery disease)    s/p MI in 2003  . Diabetes mellitus without complication ([place])    type 2  . Diverticulosis   . Hyperlipidemia   . Hypertension    systemic  . Insomnia   . Microalbuminuria   . Mild aortic insufficiency   . Mild diastolic dysfunction    slightly dilated left  atrium  . Mild mitral regurgitation by prior echocardiogram 02/10/09  . Mitral valve prolapse    borderline  . Myocardial infarction 2003  . Osteoarthritis   . Right inguinal hernia   . Tubular adenoma 2011   on colonoscopy   . Vitamin D deficiency     Past Surgical History:  Procedure Laterality Date  . APPENDECTOMY    . CARDIAC SURGERY  2005   quadruple bypass  . CATARACT EXTRACTION  12/2007, 01/16/2008   right in 2009, left in 2010  . CORONARY ARTERY BYPASS GRAFT  2003   05  . ESOPHAGOGASTRODUODENOSCOPY  09/13/2005   stricture and dilation  . EYE SURGERY     Dr. Tommi Emery  . HERNIA REPAIR    . INGUINAL HERNIA REPAIR Right 03/13/2012   Procedure: HERNIA REPAIR INGUINAL ADULT;  Surgeon: Adin Hector, MD;  Location: [facility];  Service: General;  Laterality: Right;  . INSERTION OF MESH Right 03/13/2012   Procedure: INSERTION OF MESH;  Surgeon: Adin Hector, MD;  Location: [place] [facility];  Service: General;  Laterality: Right;  . Nuclear stress test  03/2011   diaphragmatic attenuation artifact No ischemia  . PENILE PROSTHESIS IMPLANT  10/10/2000  . TIBIA FRACTURE SURGERY     steel plate insertion  . TONSILLECTOMY    .  TOTAL KNEE ARTHROPLASTY     left     Current Outpatient Prescriptions  Medication Sig Dispense Refill  . allopurinol (ZYLOPRIM) 100 MG tablet Take 100 mg by mouth Daily.     Marland Kitchen amLODipine (NORVASC) 10 MG tablet Take 1 tablet (10 mg total) by mouth daily. 90 tablet 3  . aspirin EC 81 MG tablet Take 81 mg by mouth daily.    . carvedilol (COREG) 3.125 MG tablet Take 1 tablet (3.125 mg total) by mouth 2 (two) times daily. 180 tablet 3  . Cholecalciferol (VITAMIN D3) 1000 UNITS CAPS Take 1,000 Units by mouth daily.    Marland Kitchen gabapentin (NEURONTIN) 300 MG capsule Take 300 mg by mouth 3 (three) times daily.  4  . isosorbide mononitrate (IMDUR) 30 MG 24 hr tablet Take 1 tablet (30 mg total) by mouth daily. 30 tablet 5  . metFORMIN (GLUCOPHAGE) 1000 MG tablet Take 1 tablet by  mouth 2 (two) times daily.  3  . nitroGLYCERIN (NITROSTAT) 0.4 MG SL tablet Place 1 tablet (0.4 mg total) under the tongue every 5 (five) minutes as needed for chest pain. 25 tablet 2  . pravastatin (PRAVACHOL) 40 MG tablet Take 40 mg by mouth daily. Pt take1 tablet and a half tablet once daily  12  . spironolactone (ALDACTONE) 25 MG tablet Take 25 mg by mouth Daily.      No current facility-administered medications for this visit.     Allergies:   Patient has no known allergies.    Social History:  The patient  reports that he quit smoking about 16 years ago. His smoking use included Cigarettes. He has a 50.00 pack-year smoking history. He has never used smokeless tobacco. He reports that he does not drink alcohol or use drugs.   Family History:  The patient's family history includes Alzheimer's disease in his mother; Cancer in his father; Dementia (age of onset: 39) in his mother.    ROS:  General:no colds or fevers, no weight changes Skin:no rashes or ulcers HEENT:no blurred vision, no congestion- hard of hearing wears hearing aids CV:see HPI PUL:see HPI GI:no diarrhea constipation or melena, no indigestion GU:no hematuria, no dysuria MS:no joint pain, no claudication Neuro:no syncope, no lightheadedness Endo:+ diabetes, no thyroid disease  Wt Readings from Last 3 Encounters:  03/22/16 181 lb (82.1 kg)  11/04/15 176 lb 3.2 oz (79.9 kg)  10/28/14 185 lb 4.8 oz (84.1 kg)     PHYSICAL EXAM: VS:  BP 138/70   Pulse 61   Ht 5\' 9"  (1.753 m)   Wt 181 lb (82.1 kg)   BMI 26.73 kg/m  , BMI Body mass index is 26.73 kg/m. General:Pleasant affect, NAD Skin:Warm and dry, brisk capillary refill HEENT:normocephalic, sclera clear, mucus membranes moist Neck:supple, no JVD, no bruits  Heart:S1S2 RRR without murmur, gallup, rub or click Lungs:clear without rales, rhonchi, or wheezes DGU:YQIH, non tender, + BS, do not palpate liver spleen or masses Ext:no lower ext edema, 2+ pedal  pulses, 2+ radial pulses Neuro:alert and oriented, MAE, follows commands, + facial symmetry    EKG:  EKG is ordered today. The ekg ordered today demonstrates SR at 61 no ST changes, non specific intraventricular conduction delay   Recent Labs: No results found for requested labs within last 8760 hours.    Lipid Panel No results found for: CHOL, TRIG, HDL, CHOLHDL, VLDL, LDLCALC, LDLDIRECT     Other studies Reviewed: Additional studies/ records that were reviewed today include: echo from 2011 see above,  previous OV notes..   ASSESSMENT AND PLAN:  1.  Chest pain, has improved with imdur, with grafts that are 81 years old will do lexiscan myoview if mild ischemia treat medically if + will get Dr. Sallyanne Kuster to weigh in on increasing meds vs. Cath.  He will follow up with APP on day Dr. Jerilynn Mages. Croitoru is in the office.   If severe pain, pt instructed to go to ER. Continue Imdur, ASA and sl NTG.  Also BB.    2. CAD with CABG in 2003.    3. DM-2 stable  4. HLD continue statin.    Current medicines are reviewed with the patient today.  The patient Has no concerns regarding medicines.  The following changes have been made:  See above Labs/ tests ordered today include:see above  Disposition:   FU:  see above  Signed, Cecilie Kicks, NP  03/22/2016 3:29 PM    [facility] [place], [place], [place], [place] Phone: [phone]; Fax: [phone]

## 2016-03-22 NOTE — Telephone Encounter (Signed)
Patient scheduled to see L. Dorene Ar, NP 03/22/16 @ 3pm

## 2016-03-23 ENCOUNTER — Telehealth (HOSPITAL_COMMUNITY): Payer: Self-pay | Admitting: *Deleted

## 2016-03-23 NOTE — Telephone Encounter (Signed)
Patient given detailed instructions per Myocardial Perfusion Study Information Sheet for the test on 03/28/16. Patient notified to arrive 15 minutes early and that it is imperative to arrive on time for appointment to keep from having the test rescheduled.  If you need to cancel or reschedule your appointment, please call the office within 24 hours of your appointment. Failure to do so may result in a cancellation of your appointment, and a $50 no show fee. Patient verbalized understanding.  Richard [name] Jacqueline    

## 2016-03-28 ENCOUNTER — Ambulatory Visit (HOSPITAL_COMMUNITY): Payer: Medicare Other | Attending: Cardiology

## 2016-03-28 DIAGNOSIS — I209 Angina pectoris, unspecified: Secondary | ICD-10-CM | POA: Diagnosis not present

## 2016-03-28 LAB — MYOCARDIAL PERFUSION IMAGING
CHL CUP NUCLEAR SRS: 1
CHL CUP RESTING HR STRESS: 52 {beats}/min
CSEPPHR: 77 {beats}/min
LV sys vol: 57 mL
LVDIAVOL: 118 mL (ref 62–150)
RATE: 0.31
SDS: 4
SSS: 5
TID: 0.86

## 2016-03-28 MED ORDER — TECHNETIUM TC 99M TETROFOSMIN IV KIT
10.3000 | PACK | Freq: Once | INTRAVENOUS | Status: AC | PRN
  Administered 2016-03-28: 10.3 via INTRAVENOUS
  Filled 2016-03-28: qty 11

## 2016-03-28 MED ORDER — REGADENOSON 0.4 MG/5ML IV SOLN
0.4000 mg | Freq: Once | INTRAVENOUS | Status: AC
  Administered 2016-03-28: 0.4 mg via INTRAVENOUS

## 2016-03-28 MED ORDER — TECHNETIUM TC 99M TETROFOSMIN IV KIT
32.3000 | PACK | Freq: Once | INTRAVENOUS | Status: AC | PRN
  Administered 2016-03-28: 32.3 via INTRAVENOUS
  Filled 2016-03-28: qty 33

## 2016-04-11 ENCOUNTER — Ambulatory Visit (INDEPENDENT_AMBULATORY_CARE_PROVIDER_SITE_OTHER): Payer: Medicare Other | Admitting: Cardiology

## 2016-04-11 ENCOUNTER — Encounter: Payer: Self-pay | Admitting: Cardiology

## 2016-04-11 DIAGNOSIS — Z951 Presence of aortocoronary bypass graft: Secondary | ICD-10-CM

## 2016-04-11 DIAGNOSIS — E785 Hyperlipidemia, unspecified: Secondary | ICD-10-CM

## 2016-04-11 DIAGNOSIS — E119 Type 2 diabetes mellitus without complications: Secondary | ICD-10-CM

## 2016-04-11 DIAGNOSIS — R079 Chest pain, unspecified: Secondary | ICD-10-CM | POA: Diagnosis not present

## 2016-04-11 DIAGNOSIS — I10 Essential (primary) hypertension: Secondary | ICD-10-CM

## 2016-04-11 NOTE — Progress Notes (Signed)
04/11/2016 Richard [name]   [birth year]  [id number]  Primary Physician Jani Gravel, MD Primary Cardiologist: Dr Sallyanne Kuster  HPI:  83 y.o.AA male with a hx of CAD with a myocardial infarction in 2003 followed by multivessel bypass surgery (Dr. Cyndia Bent: LIMA to LAD, SVG to OM, sequential SVG to branches of ramus intermedius, SVG to RCA). He has had preserved left ventricular systolic function, mild hyperlipidemia, hypertension, diabetes mellitus, hyperuricemia and rheumatoid arthritis. His last functional study was a low risk nuclear stress test in 2013 that described diaphragmatic attenuation artifact and normal left ventricular systolic function. He was placed on Imdur and had a Myoview 03/28/16 that was low risk. He is in the office today for follow up. He has not had recurrent chest pain.    Current Outpatient Prescriptions  Medication Sig Dispense Refill  . allopurinol (ZYLOPRIM) 100 MG tablet Take 100 mg by mouth Daily.     Marland Kitchen amLODipine (NORVASC) 10 MG tablet Take 1 tablet (10 mg total) by mouth daily. 90 tablet 3  . aspirin EC 81 MG tablet Take 81 mg by mouth daily.    . carvedilol (COREG) 3.125 MG tablet Take 1 tablet (3.125 mg total) by mouth 2 (two) times daily. 180 tablet 3  . Cholecalciferol (VITAMIN D3) 1000 UNITS CAPS Take 1,000 Units by mouth daily.    Marland Kitchen gabapentin (NEURONTIN) 300 MG capsule Take 300 mg by mouth 3 (three) times daily.  4  . isosorbide mononitrate (IMDUR) 30 MG 24 hr tablet Take 1 tablet (30 mg total) by mouth daily. 30 tablet 5  . metFORMIN (GLUCOPHAGE) 1000 MG tablet Take 1 tablet by mouth 2 (two) times daily.  3  . nitroGLYCERIN (NITROSTAT) 0.4 MG SL tablet Place 1 tablet (0.4 mg total) under the tongue every 5 (five) minutes as needed for chest pain. 25 tablet 2  . pravastatin (PRAVACHOL) 40 MG tablet Take 40 mg by mouth daily. Pt take1 tablet and a half tablet once daily  12  . spironolactone (ALDACTONE) 25 MG tablet Take 25 mg by mouth Daily.      No current  facility-administered medications for this visit.     No Known Allergies  Past Medical History:  Diagnosis Date  . Barrett esophagus    history of  . CAD (coronary artery disease)    s/p MI in 2003  . Diabetes mellitus without complication ([place])    type 2  . Diverticulosis   . Hyperlipidemia   . Hypertension    systemic  . Insomnia   . Microalbuminuria   . Mild aortic insufficiency   . Mild diastolic dysfunction    slightly dilated left atrium  . Mild mitral regurgitation by prior echocardiogram 02/10/09  . Mitral valve prolapse    borderline  . Myocardial infarction 2003  . Osteoarthritis   . Right inguinal hernia   . Tubular adenoma 2011   on colonoscopy   . Vitamin D deficiency     Social History   Social History  . Marital status: Married    Spouse name: N/A  . Number of children: N/A  . Years of education: N/A   Occupational History  . Not on file.   Social History Main Topics  . Smoking status: Former Smoker    Packs/day: 1.00    Years: 50.00    Types: Cigarettes    Quit date: 02/08/2000  . Smokeless tobacco: Never Used  . Alcohol use No  . Drug use: No  . Sexual activity:  Not on file   Other Topics Concern  . Not on file   Social History Narrative  . No narrative on file     Family History  Problem Relation Age of Onset  . Alzheimer's disease Mother   . Dementia Mother 37  . Cancer Father     lung, metastatic to brain     Review of Systems: General: negative for chills, fever, night sweats or weight changes.  Cardiovascular: negative for chest pain, dyspnea on exertion, edema, orthopnea, palpitations, paroxysmal nocturnal dyspnea or shortness of breath Dermatological: negative for rash Respiratory: negative for cough or wheezing Urologic: negative for hematuria Abdominal: negative for nausea, vomiting, diarrhea, bright red blood per rectum, melena, or hematemesis Neurologic: negative for visual changes, syncope, or dizziness All other  systems reviewed and are otherwise negative except as noted above.    Blood pressure 123/71, pulse 67, height 5\' 10"  (1.778 m), weight 183 lb 3.2 oz (83.1 kg).  General appearance: alert, cooperative and no distress Neck: no carotid bruit and no JVD Lungs: clear to auscultation bilaterally Heart: regular rate and rhythm Neurologic: Grossly normal   ASSESSMENT AND PLAN:   Chest pain with moderate risk of acute coronary syndrome Pt seen March 2018 in the office with chest pain- Imdur added, Myoview low risk  Hx of CABG 2003 by Dr. Cyndia Bent: LIMA to LAD, SVG to OM, sequential SVG to branches of ramus intermedius, SVG to RCA  Essential hypertension Controlled  Dyslipidemia On Pravachol  Non-insulin treated type 2 diabetes mellitus ([place]) Glucophage   PLAN  Discussed with Dr Sallyanne Kuster- same Rx. He'll call if he has recurrent chest pain. F/U 6 months.  Kerin Ransom PA-C 04/11/2016 8:53 AM

## 2016-04-11 NOTE — Assessment & Plan Note (Signed)
Pt seen March 2018 in the office with chest pain- Imdur added, Myoview low risk

## 2016-04-11 NOTE — Assessment & Plan Note (Signed)
On Pravachol 

## 2016-04-11 NOTE — Assessment & Plan Note (Signed)
Controlled.  

## 2016-04-11 NOTE — Patient Instructions (Signed)
Medication Instructions:  Continue current medications  Labwork: None Ordered  Testing/Procedures: None ordered  Follow-Up: Your physician wants you to follow-up in: 6 Months with Dr Sallyanne Kuster. You will receive a reminder letter in the mail two months in advance. If you don't receive a letter, please call our office to schedule the follow-up appointment.   Any Other Special Instructions Will Be Listed Below (If Applicable).   If you need a refill on your cardiac medications before your next appointment, please call your pharmacy.

## 2016-04-11 NOTE — Assessment & Plan Note (Signed)
2003 by Dr. Cyndia Bent: LIMA to LAD, SVG to OM, sequential SVG to branches of ramus intermedius, SVG to RCA

## 2016-04-11 NOTE — Assessment & Plan Note (Signed)
Glucophage

## 2016-09-18 ENCOUNTER — Other Ambulatory Visit: Payer: Self-pay | Admitting: Cardiovascular Disease

## 2016-10-16 ENCOUNTER — Encounter: Payer: Self-pay | Admitting: Cardiovascular Disease

## 2016-10-16 ENCOUNTER — Ambulatory Visit (INDEPENDENT_AMBULATORY_CARE_PROVIDER_SITE_OTHER): Payer: Medicare Other | Admitting: Cardiovascular Disease

## 2016-10-16 VITALS — BP 110/60 | HR 48 | Ht 70.0 in | Wt 174.0 lb

## 2016-10-16 DIAGNOSIS — E119 Type 2 diabetes mellitus without complications: Secondary | ICD-10-CM | POA: Diagnosis not present

## 2016-10-16 DIAGNOSIS — I25118 Atherosclerotic heart disease of native coronary artery with other forms of angina pectoris: Secondary | ICD-10-CM | POA: Diagnosis not present

## 2016-10-16 DIAGNOSIS — E785 Hyperlipidemia, unspecified: Secondary | ICD-10-CM | POA: Diagnosis not present

## 2016-10-16 DIAGNOSIS — I10 Essential (primary) hypertension: Secondary | ICD-10-CM

## 2016-10-16 DIAGNOSIS — R001 Bradycardia, unspecified: Secondary | ICD-10-CM | POA: Diagnosis not present

## 2016-10-16 MED ORDER — ALLOPURINOL 100 MG PO TABS: 100.0000 mg | ORAL_TABLET | Freq: Every day | ORAL | 3 refills | Status: AC

## 2016-10-16 NOTE — Patient Instructions (Signed)
Dr Sallyanne Kuster has recommended making the following medication changes: 1. WEAN OFF Carvedilol - take 1 tablet once daily for 2 days then STOP  Your physician recommends that you schedule a follow-up appointment in 12 months. You will receive a reminder letter in the mail two months in advance. If you don't receive a letter, please call our office to schedule the follow-up appointment.  If you need a refill on your cardiac medications before your next appointment, please call your pharmacy.

## 2016-10-16 NOTE — Progress Notes (Signed)
Cardiology Office Note    Date:  10/16/2016   ID:  Richard [name], DOB [birth year], MRN [id number]  PCP:  Jani Gravel, MD  Cardiologist:   Sanda Klein, MD   Chief Complaint  Patient presents with  . Follow-up    pt c/o chest pain     History of Present Illness:  Richard [name] is a 83 y.o. male with history of coronary disease with a myocardial infarction 2003 followed by multivessel bypass surgery (Dr. Cyndia Bent: LIMA to LAD, SVG to OM, sequential SVG to branches of ramus intermedius, SVG to RCA), with preserved left ventricular systolic function and mild hyperlipidemia, hypertension and borderline diabetes mellitus. He also has hyperuricemia and rheumatoid arthritis. His last functional study was a low risk nuclear stress test in 2013 that described diaphragmatic attenuation artifact and normal left ventricular systolic function.  He has not had angina pectoris. He has occasional chest discomfort that shoots from right to left, while at rest, distinctly nonexertional. He still complains of stinging discomfort at his sternotomy scar. His gait is less steady and he moves slower than before, but he has not had falls. He denies dizziness, lightheadedness, syncope or focal neurological events. He does not have leg edema or claudication and denies dyspnea at rest or with exertion. He has slowly lost weight over the last year. Does not have dysphagia and claims his appetite is good.  Past Medical History:  Diagnosis Date  . Barrett esophagus    history of  . CAD (coronary artery disease)    s/p MI in 2003  . Diabetes mellitus without complication ([place])    type 2  . Diverticulosis   . Hyperlipidemia   . Hypertension    systemic  . Insomnia   . Microalbuminuria   . Mild aortic insufficiency   . Mild diastolic dysfunction    slightly dilated left atrium  . Mild mitral regurgitation by prior echocardiogram 02/10/09  . Mitral valve prolapse    borderline  . Myocardial infarction ([place]) 2003    . Osteoarthritis   . Right inguinal hernia   . Tubular adenoma 2011   on colonoscopy   . Vitamin D deficiency     Past Surgical History:  Procedure Laterality Date  . APPENDECTOMY    . CARDIAC SURGERY  2005   quadruple bypass  . CATARACT EXTRACTION  12/2007, 01/16/2008   right in 2009, left in 2010  . CORONARY ARTERY BYPASS GRAFT  2003   05  . ESOPHAGOGASTRODUODENOSCOPY  09/13/2005   stricture and dilation  . EYE SURGERY     Dr. Tommi Emery  . HERNIA REPAIR    . INGUINAL HERNIA REPAIR Right 03/13/2012   Procedure: HERNIA REPAIR INGUINAL ADULT;  Surgeon: Adin Hector, MD;  Location: [facility];  Service: General;  Laterality: Right;  . INSERTION OF MESH Right 03/13/2012   Procedure: INSERTION OF MESH;  Surgeon: Adin Hector, MD;  Location: [facility];  Service: General;  Laterality: Right;  . Nuclear stress test  03/2011   diaphragmatic attenuation artifact No ischemia  . PENILE PROSTHESIS IMPLANT  10/10/2000  . TIBIA FRACTURE SURGERY     steel plate insertion  . TONSILLECTOMY    . TOTAL KNEE ARTHROPLASTY     left    Current Medications: Outpatient Medications Prior to Visit  Medication Sig Dispense Refill  . amLODipine (NORVASC) 10 MG tablet Take 1 tablet (10 mg total) by mouth daily. 90 tablet 3  . aspirin EC 81 MG  tablet Take 81 mg by mouth daily.    . carvedilol (COREG) 3.125 MG tablet Take 1 tablet (3.125 mg total) by mouth 2 (two) times daily. 180 tablet 3  . Cholecalciferol (VITAMIN D3) 1000 UNITS CAPS Take 1,000 Units by mouth daily.    Marland Kitchen gabapentin (NEURONTIN) 300 MG capsule Take 300 mg by mouth 3 (three) times daily.  4  . isosorbide mononitrate (IMDUR) 30 MG 24 hr tablet TAKE 1 TABLET(30 MG) BY MOUTH DAILY 30 tablet 6  . metFORMIN (GLUCOPHAGE) 1000 MG tablet Take 1 tablet by mouth 2 (two) times daily.  3  . nitroGLYCERIN (NITROSTAT) 0.4 MG SL tablet Place 1 tablet (0.4 mg total) under the tongue every 5 (five) minutes as needed for chest pain. 25 tablet 2  .  pravastatin (PRAVACHOL) 40 MG tablet Take 40 mg by mouth daily. Pt take1 tablet and a half tablet once daily  12  . spironolactone (ALDACTONE) 25 MG tablet Take 25 mg by mouth Daily.     Marland Kitchen allopurinol (ZYLOPRIM) 100 MG tablet Take 100 mg by mouth Daily.      No facility-administered medications prior to visit.      Allergies:   Patient has no known allergies.   Social History   Social History  . Marital status: Married    Spouse name: N/A  . Number of children: N/A  . Years of education: N/A   Social History Main Topics  . Smoking status: Former Smoker    Packs/day: 1.00    Years: 50.00    Types: Cigarettes    Quit date: 02/08/2000  . Smokeless tobacco: Never Used  . Alcohol use No  . Drug use: No  . Sexual activity: Not Asked   Other Topics Concern  . None   Social History Narrative  . None     Family History:  The patient's family history includes Alzheimer's disease in his mother; Cancer in his father; Dementia (age of onset: 74) in his mother.   ROS:   Please see the history of present illness.    ROS All other systems reviewed and are negative.   PHYSICAL EXAM:   VS:  BP 110/60 (BP Location: Left Arm, Patient Position: Sitting, Cuff Size: Normal)   Pulse (!) 48   Ht 5\' 10"  (1.778 m)   Wt 174 lb (78.9 kg)   BMI 24.97 kg/m     General: Alert, oriented x3, no distress, hard of hearing Head: no evidence of trauma, PERRL, EOMI, no exophtalmos or lid lag, no myxedema, no xanthelasma; normal ears, nose and oropharynx Neck: normal jugular venous pulsations and no hepatojugular reflux; brisk carotid pulses without delay and no carotid bruits Chest: clear to auscultation, no signs of consolidation by percussion or palpation, normal fremitus, symmetrical and full respiratory excursions Cardiovascular: normal position and quality of the apical impulse, regular rhythm, normal first and second heart sounds, no murmurs, rubs or gallops. Hyperpigmentation around her  sternotomy scar with some keloid formation in the lower third. Abdomen: no tenderness or distention, no masses by palpation, no abnormal pulsatility or arterial bruits, normal bowel sounds, no hepatosplenomegaly Extremities: no clubbing, cyanosis or edema; 2+ radial, ulnar and brachial pulses bilaterally; 2+ right femoral, posterior tibial and dorsalis pedis pulses; 2+ left femoral, posterior tibial and dorsalis pedis pulses; no subclavian or femoral bruits Neurological: grossly nonfocal Psych: Normal mood and affect   Wt Readings from Last 3 Encounters:  10/16/16 174 lb (78.9 kg)  04/11/16 183 lb 3.2 oz (83.1  kg)  03/28/16 181 lb (82.1 kg)      Studies/Labs Reviewed:   EKG:  EKG is ordered today.  The ekg ordered today demonstrates Sinus bradycardia QS pattern in leads V1-V2, nonspecific T-wave flattening in the inferior leads and V6  Recent Labs: February 2018 hemoglobin 12.3, normal LFTs, creatinine 1.04, potassium 4.4, glucose 91, hemoglobin A1c 5.7% Lipid Panel February 2018 total cholesterol 146, HDL 48, LDL 84, triglycerides 72  ASSESSMENT:    1. Coronary artery disease of native artery of native heart with stable angina pectoris ([place])   2. Sinus bradycardia   3. Dyslipidemia   4. Non-insulin treated type 2 diabetes mellitus ([place])   5. Essential hypertension     PLAN:  In order of problems listed above:  1. CAD: 16 years have passed since bypass surgery. Does not have angina on 3 different antianginal medications, but will stop his carvedilol today. His activity level is limited. 2. Bradycardia: Stop the beta blocker. Take it once daily only for a couple of days to wean off. If he develop symptoms of bradycardia he may need a pacemaker. 3. HLP: All parameters are close to goal or at target 4. DM: Excellent control 5. HTN: Excellent control. Doubts that he will need any adjustment in medications even if he stops his carvedilol.    Medication Adjustments/Labs and Tests  Ordered: Current medicines are reviewed at length with the patient today.  Concerns regarding medicines are outlined above.  Medication changes, Labs and Tests ordered today are listed in the Patient Instructions below. Patient Instructions  Dr Sallyanne Kuster has recommended making the following medication changes: 1. WEAN OFF Carvedilol - take 1 tablet once daily for 2 days then STOP  Your physician recommends that you schedule a follow-up appointment in 12 months. You will receive a reminder letter in the mail two months in advance. If you don't receive a letter, please call our office to schedule the follow-up appointment.  If you need a refill on your cardiac medications before your next appointment, please call your pharmacy.    Signed, Sanda Klein, MD  10/16/2016 8:52 AM    [facility] [place], [place], [place]  [address] Phone: [phone]; Fax: [phone]

## 2016-11-06 ENCOUNTER — Other Ambulatory Visit: Payer: Self-pay | Admitting: Cardiovascular Disease

## 2016-11-06 NOTE — Telephone Encounter (Signed)
REFILL 

## 2016-11-16 DIAGNOSIS — E1142 Type 2 diabetes mellitus with diabetic polyneuropathy: Secondary | ICD-10-CM | POA: Diagnosis not present

## 2016-11-16 DIAGNOSIS — I10 Essential (primary) hypertension: Secondary | ICD-10-CM | POA: Diagnosis not present

## 2016-11-16 DIAGNOSIS — Z125 Encounter for screening for malignant neoplasm of prostate: Secondary | ICD-10-CM | POA: Diagnosis not present

## 2016-11-20 DIAGNOSIS — I10 Essential (primary) hypertension: Secondary | ICD-10-CM | POA: Diagnosis not present

## 2016-11-20 DIAGNOSIS — E1142 Type 2 diabetes mellitus with diabetic polyneuropathy: Secondary | ICD-10-CM | POA: Diagnosis not present

## 2016-11-20 DIAGNOSIS — N390 Urinary tract infection, site not specified: Secondary | ICD-10-CM | POA: Diagnosis not present

## 2016-11-23 DIAGNOSIS — G8929 Other chronic pain: Secondary | ICD-10-CM | POA: Diagnosis not present

## 2016-11-23 DIAGNOSIS — M545 Low back pain: Secondary | ICD-10-CM | POA: Diagnosis not present

## 2016-11-23 DIAGNOSIS — Z23 Encounter for immunization: Secondary | ICD-10-CM | POA: Diagnosis not present

## 2016-11-23 DIAGNOSIS — Z0000 Encounter for general adult medical examination without abnormal findings: Secondary | ICD-10-CM | POA: Diagnosis not present

## 2016-12-08 DIAGNOSIS — R634 Abnormal weight loss: Secondary | ICD-10-CM | POA: Diagnosis not present

## 2016-12-08 DIAGNOSIS — R972 Elevated prostate specific antigen [PSA]: Secondary | ICD-10-CM | POA: Diagnosis not present

## 2016-12-08 DIAGNOSIS — E119 Type 2 diabetes mellitus without complications: Secondary | ICD-10-CM | POA: Diagnosis not present

## 2016-12-08 DIAGNOSIS — N3000 Acute cystitis without hematuria: Secondary | ICD-10-CM | POA: Diagnosis not present

## 2016-12-11 ENCOUNTER — Other Ambulatory Visit: Payer: Self-pay | Admitting: Internal Medicine

## 2016-12-11 DIAGNOSIS — R634 Abnormal weight loss: Secondary | ICD-10-CM

## 2016-12-11 DIAGNOSIS — R911 Solitary pulmonary nodule: Secondary | ICD-10-CM

## 2016-12-25 ENCOUNTER — Other Ambulatory Visit: Payer: Self-pay | Admitting: Internal Medicine

## 2016-12-25 DIAGNOSIS — R634 Abnormal weight loss: Secondary | ICD-10-CM | POA: Diagnosis not present

## 2016-12-25 DIAGNOSIS — E7800 Pure hypercholesterolemia, unspecified: Secondary | ICD-10-CM | POA: Diagnosis not present

## 2016-12-25 DIAGNOSIS — R972 Elevated prostate specific antigen [PSA]: Secondary | ICD-10-CM | POA: Diagnosis not present

## 2016-12-25 DIAGNOSIS — M109 Gout, unspecified: Secondary | ICD-10-CM | POA: Diagnosis not present

## 2016-12-26 ENCOUNTER — Ambulatory Visit
Admission: RE | Admit: 2016-12-26 | Discharge: 2016-12-26 | Disposition: A | Discharge status: Home / Self Care | Payer: Medicare Other | Source: Ambulatory Visit | Attending: Internal Medicine | Admitting: Internal Medicine

## 2016-12-26 DIAGNOSIS — R911 Solitary pulmonary nodule: Secondary | ICD-10-CM

## 2016-12-26 DIAGNOSIS — R634 Abnormal weight loss: Secondary | ICD-10-CM

## 2016-12-26 DIAGNOSIS — J984 Other disorders of lung: Secondary | ICD-10-CM | POA: Diagnosis not present

## 2016-12-26 DIAGNOSIS — K449 Diaphragmatic hernia without obstruction or gangrene: Secondary | ICD-10-CM | POA: Diagnosis not present

## 2016-12-26 MED ORDER — IOPAMIDOL (ISOVUE-300) INJECTION 61%
100.0000 mL | Freq: Once | INTRAVENOUS | Status: AC | PRN
  Administered 2016-12-26: 100 mL via INTRAVENOUS

## 2016-12-27 DIAGNOSIS — R634 Abnormal weight loss: Secondary | ICD-10-CM | POA: Diagnosis not present

## 2017-01-10 DIAGNOSIS — R972 Elevated prostate specific antigen [PSA]: Secondary | ICD-10-CM | POA: Diagnosis not present

## 2017-01-10 DIAGNOSIS — N400 Enlarged prostate without lower urinary tract symptoms: Secondary | ICD-10-CM | POA: Diagnosis not present

## 2017-04-16 DIAGNOSIS — E119 Type 2 diabetes mellitus without complications: Secondary | ICD-10-CM | POA: Diagnosis not present

## 2017-04-16 DIAGNOSIS — R2681 Unsteadiness on feet: Secondary | ICD-10-CM | POA: Diagnosis not present

## 2017-04-16 DIAGNOSIS — R634 Abnormal weight loss: Secondary | ICD-10-CM | POA: Diagnosis not present

## 2017-04-17 ENCOUNTER — Other Ambulatory Visit: Payer: Self-pay | Admitting: Internal Medicine

## 2017-04-17 DIAGNOSIS — M069 Rheumatoid arthritis, unspecified: Secondary | ICD-10-CM | POA: Diagnosis not present

## 2017-04-17 DIAGNOSIS — M47816 Spondylosis without myelopathy or radiculopathy, lumbar region: Secondary | ICD-10-CM | POA: Diagnosis not present

## 2017-04-17 DIAGNOSIS — E1142 Type 2 diabetes mellitus with diabetic polyneuropathy: Secondary | ICD-10-CM | POA: Diagnosis not present

## 2017-04-17 DIAGNOSIS — I252 Old myocardial infarction: Secondary | ICD-10-CM | POA: Diagnosis not present

## 2017-04-17 DIAGNOSIS — M6281 Muscle weakness (generalized): Secondary | ICD-10-CM | POA: Diagnosis not present

## 2017-04-17 DIAGNOSIS — I10 Essential (primary) hypertension: Secondary | ICD-10-CM | POA: Diagnosis not present

## 2017-04-17 DIAGNOSIS — E785 Hyperlipidemia, unspecified: Secondary | ICD-10-CM | POA: Diagnosis not present

## 2017-04-17 DIAGNOSIS — M1990 Unspecified osteoarthritis, unspecified site: Secondary | ICD-10-CM | POA: Diagnosis not present

## 2017-04-17 DIAGNOSIS — R634 Abnormal weight loss: Secondary | ICD-10-CM

## 2017-04-17 DIAGNOSIS — M519 Unspecified thoracic, thoracolumbar and lumbosacral intervertebral disc disorder: Secondary | ICD-10-CM | POA: Diagnosis not present

## 2017-04-17 DIAGNOSIS — I2510 Atherosclerotic heart disease of native coronary artery without angina pectoris: Secondary | ICD-10-CM | POA: Diagnosis not present

## 2017-04-17 DIAGNOSIS — G8929 Other chronic pain: Secondary | ICD-10-CM | POA: Diagnosis not present

## 2017-04-17 DIAGNOSIS — M1A9XX0 Chronic gout, unspecified, without tophus (tophi): Secondary | ICD-10-CM | POA: Diagnosis not present

## 2017-04-17 DIAGNOSIS — D649 Anemia, unspecified: Secondary | ICD-10-CM | POA: Diagnosis not present

## 2017-04-26 ENCOUNTER — Ambulatory Visit
Admission: RE | Admit: 2017-04-26 | Discharge: 2017-04-26 | Disposition: A | Discharge status: Home / Self Care | Payer: Medicare Other | Source: Ambulatory Visit | Attending: Internal Medicine | Admitting: Internal Medicine

## 2017-04-26 DIAGNOSIS — R1013 Epigastric pain: Secondary | ICD-10-CM | POA: Diagnosis not present

## 2017-04-26 DIAGNOSIS — R634 Abnormal weight loss: Secondary | ICD-10-CM

## 2017-04-26 MED ORDER — IOPAMIDOL (ISOVUE-300) INJECTION 61%
100.0000 mL | Freq: Once | INTRAVENOUS | Status: AC | PRN
  Administered 2017-04-26: 100 mL via INTRAVENOUS

## 2017-05-01 DIAGNOSIS — E119 Type 2 diabetes mellitus without complications: Secondary | ICD-10-CM | POA: Diagnosis not present

## 2017-05-01 DIAGNOSIS — M109 Gout, unspecified: Secondary | ICD-10-CM | POA: Diagnosis not present

## 2017-05-01 DIAGNOSIS — I10 Essential (primary) hypertension: Secondary | ICD-10-CM | POA: Diagnosis not present

## 2017-05-01 DIAGNOSIS — E7800 Pure hypercholesterolemia, unspecified: Secondary | ICD-10-CM | POA: Diagnosis not present

## 2017-05-04 ENCOUNTER — Other Ambulatory Visit: Payer: Self-pay

## 2017-05-04 MED ORDER — ISOSORBIDE MONONITRATE ER 30 MG PO TB24: ORAL_TABLET | ORAL | 6 refills | Status: DC

## 2017-06-26 DIAGNOSIS — R972 Elevated prostate specific antigen [PSA]: Secondary | ICD-10-CM | POA: Diagnosis not present

## 2017-07-04 DIAGNOSIS — R972 Elevated prostate specific antigen [PSA]: Secondary | ICD-10-CM | POA: Diagnosis not present

## 2017-07-04 DIAGNOSIS — R8279 Other abnormal findings on microbiological examination of urine: Secondary | ICD-10-CM | POA: Diagnosis not present

## 2017-10-25 DIAGNOSIS — I10 Essential (primary) hypertension: Secondary | ICD-10-CM | POA: Diagnosis not present

## 2017-10-25 DIAGNOSIS — E119 Type 2 diabetes mellitus without complications: Secondary | ICD-10-CM | POA: Diagnosis not present

## 2017-10-25 DIAGNOSIS — M109 Gout, unspecified: Secondary | ICD-10-CM | POA: Diagnosis not present

## 2017-11-01 DIAGNOSIS — G4700 Insomnia, unspecified: Secondary | ICD-10-CM | POA: Diagnosis not present

## 2017-11-01 DIAGNOSIS — E119 Type 2 diabetes mellitus without complications: Secondary | ICD-10-CM | POA: Diagnosis not present

## 2017-11-01 DIAGNOSIS — I10 Essential (primary) hypertension: Secondary | ICD-10-CM | POA: Diagnosis not present

## 2017-11-01 DIAGNOSIS — I2510 Atherosclerotic heart disease of native coronary artery without angina pectoris: Secondary | ICD-10-CM | POA: Diagnosis not present

## 2017-11-03 ENCOUNTER — Other Ambulatory Visit: Payer: Self-pay | Admitting: Cardiovascular Disease

## 2017-11-15 DIAGNOSIS — G4700 Insomnia, unspecified: Secondary | ICD-10-CM | POA: Diagnosis not present

## 2017-11-22 ENCOUNTER — Ambulatory Visit: Payer: Medicare Other | Admitting: Cardiovascular Disease

## 2017-11-23 ENCOUNTER — Other Ambulatory Visit: Payer: Self-pay | Admitting: Cardiovascular Disease

## 2017-11-29 ENCOUNTER — Ambulatory Visit: Payer: Medicare Other | Admitting: Physician Assistant

## 2017-12-19 ENCOUNTER — Encounter: Payer: Self-pay | Admitting: Physician Assistant

## 2017-12-19 ENCOUNTER — Ambulatory Visit: Payer: Medicare Other | Admitting: Physician Assistant

## 2017-12-19 VITALS — BP 134/72 | HR 70 | Ht 70.0 in | Wt 159.8 lb

## 2017-12-19 DIAGNOSIS — I10 Essential (primary) hypertension: Secondary | ICD-10-CM | POA: Diagnosis not present

## 2017-12-19 DIAGNOSIS — E119 Type 2 diabetes mellitus without complications: Secondary | ICD-10-CM | POA: Diagnosis not present

## 2017-12-19 DIAGNOSIS — I25709 Atherosclerosis of coronary artery bypass graft(s), unspecified, with unspecified angina pectoris: Secondary | ICD-10-CM | POA: Diagnosis not present

## 2017-12-19 DIAGNOSIS — E785 Hyperlipidemia, unspecified: Secondary | ICD-10-CM | POA: Diagnosis not present

## 2017-12-19 MED ORDER — ISOSORBIDE MONONITRATE ER 60 MG PO TB24: ORAL_TABLET | ORAL | 1 refills | Status: DC

## 2017-12-19 NOTE — Progress Notes (Signed)
Cardiology Office Note    Date:  12/19/2017   ID:  Richard [name], DOB [birth year], MRN [id number]  PCP:  Jani Gravel, MD  Cardiologist: Dr. Sallyanne Kuster  Chief Complaint  Patient presents with  . Follow-up    occasional chest pains that come and go, denies SOB and swelling    History of Present Illness:  Richard [name] is a 84 y.o. male with PMH of CAD s/p CABG, DM 2, HTN, HLD, and history of osteoarthritis.  Patient had a myocardial infarction in 2003 followed by a CABG by Dr. Cyndia Bent with LIMA to LAD, SVG to OM and a sequential SVG to branches of ramus intermedius, SVG to RCA.  He had preserved LV systolic function.  Myoview in 2013 was low risk with diaphragmatic attenuation artifact, normal EF.  Last Myoview performed on 03/28/2016 showed EF 52%, overall low risk study without significant ischemia or infarction.  Patient was last seen by Dr. Sallyanne Kuster on 10/16/2016, he was doing well at the time with only occasional chest discomfort that shoots from the right to the left while at rest.  His heart rate was in the high 40s at the time despite being asymptomatic, his beta-blocker was stopped.  Patient presents today for cardiology office visit.  He is no longer bradycardic.  He denies any recent lower extremity edema, orthopnea or PND.  He has some baseline dyspnea with exertion.  According to his daughter who accompanied him to today's visit, he does not do much activity and spends majority of the day sitting in the sofa watching TV.  He continued to have the same intermittent chest discomfort he had last year.  This chest discomfort occurs multiple times throughout the week and the last only a few seconds at a time.  He denies any correlations with exertion, however admits that he does not exert himself very much.  The frequency of the chest discomfort has increased however the duration of the chest discomfort remain a few seconds at a time.  In the past 2 weeks, he has only needed to use one  nitroglycerin.  I plan to increase his Imdur to 60 mg daily and bring him back for reassessment in 75-month.  The characteristic of his chest discomfort is somewhat atypical, however the increased frequency is concerning.   Past Medical History:  Diagnosis Date  . Barrett esophagus    history of  . CAD (coronary artery disease)    s/p MI in 2003  . Diabetes mellitus without complication ([place])    type 2  . Diverticulosis   . Hyperlipidemia   . Hypertension    systemic  . Insomnia   . Microalbuminuria   . Mild aortic insufficiency   . Mild diastolic dysfunction    slightly dilated left atrium  . Mild mitral regurgitation by prior echocardiogram 02/10/09  . Mitral valve prolapse    borderline  . Myocardial infarction ([place]) 2003  . Osteoarthritis   . Right inguinal hernia   . Tubular adenoma 2011   on colonoscopy   . Vitamin D deficiency     Past Surgical History:  Procedure Laterality Date  . APPENDECTOMY    . CARDIAC SURGERY  2005   quadruple bypass  . CATARACT EXTRACTION  12/2007, 01/16/2008   right in 2009, left in 2010  . CORONARY ARTERY BYPASS GRAFT  2003   05  . ESOPHAGOGASTRODUODENOSCOPY  09/13/2005   stricture and dilation  . EYE SURGERY     Dr. Tommi Emery  .  HERNIA REPAIR    . INGUINAL HERNIA REPAIR Right 03/13/2012   Procedure: HERNIA REPAIR INGUINAL ADULT;  Surgeon: Adin Hector, MD;  Location: [facility];  Service: General;  Laterality: Right;  . INSERTION OF MESH Right 03/13/2012   Procedure: INSERTION OF MESH;  Surgeon: Adin Hector, MD;  Location: [facility];  Service: General;  Laterality: Right;  . Nuclear stress test  03/2011   diaphragmatic attenuation artifact No ischemia  . PENILE PROSTHESIS IMPLANT  10/10/2000  . TIBIA FRACTURE SURGERY     steel plate insertion  . TONSILLECTOMY    . TOTAL KNEE ARTHROPLASTY     left    Current Medications: Outpatient Medications Prior to Visit  Medication Sig Dispense Refill  . allopurinol (ZYLOPRIM) 100 MG tablet  Take 1 tablet (100 mg total) by mouth daily. 90 tablet 3  . amLODipine (NORVASC) 10 MG tablet TAKE 1 TABLET BY MOUTH EVERY DAY 90 tablet 0  . aspirin EC 81 MG tablet Take 81 mg by mouth daily.    . carvedilol (COREG) 3.125 MG tablet Take 1 tablet (3.125 mg total) by mouth 2 (two) times daily. 180 tablet 3  . Cholecalciferol (VITAMIN D3) 1000 UNITS CAPS Take 1,000 Units by mouth daily.    Marland Kitchen gabapentin (NEURONTIN) 300 MG capsule Take 300 mg by mouth 3 (three) times daily.  4  . metFORMIN (GLUCOPHAGE) 1000 MG tablet Take 1 tablet by mouth 2 (two) times daily.  3  . pravastatin (PRAVACHOL) 40 MG tablet Take 40 mg by mouth daily. Pt take1 tablet and a half tablet once daily  12  . spironolactone (ALDACTONE) 25 MG tablet Take 25 mg by mouth Daily.     . isosorbide mononitrate (IMDUR) 30 MG 24 hr tablet TAKE 1 TABLET(30 MG) BY MOUTH DAILY 30 tablet 6  . nitroGLYCERIN (NITROSTAT) 0.4 MG SL tablet Place 1 tablet (0.4 mg total) under the tongue every 5 (five) minutes as needed for chest pain. 25 tablet 2   No facility-administered medications prior to visit.      Allergies:   Patient has no known allergies.   Social History   Socioeconomic History  . Marital status: Married    Spouse name: Not on file  . Number of children: Not on file  . Years of education: Not on file  . Highest education level: Not on file  Occupational History  . Not on file  Social Needs  . Financial resource strain: Not on file  . Food insecurity:    Worry: Not on file    Inability: Not on file  . Transportation needs:    Medical: Not on file    Non-medical: Not on file  Tobacco Use  . Smoking status: Former Smoker    Packs/day: 1.00    Years: 50.00    Pack years: 50.00    Types: Cigarettes    Last attempt to quit: 02/08/2000    Years since quitting: 17.8  . Smokeless tobacco: Never Used  Substance and Sexual Activity  . Alcohol use: No  . Drug use: No  . Sexual activity: Not on file  Lifestyle  . Physical  activity:    Days per week: Not on file    Minutes per session: Not on file  . Stress: Not on file  Relationships  . Social connections:    Talks on phone: Not on file    Gets together: Not on file    Attends religious service: Not on file  Active member of club or organization: Not on file    Attends meetings of clubs or organizations: Not on file    Relationship status: Not on file  Other Topics Concern  . Not on file  Social History Narrative  . Not on file     Family History:  The patient's family history includes Alzheimer's disease in his mother; Cancer in his father; Dementia (age of onset: 64) in his mother.   ROS:   Please see the history of present illness.    ROS All other systems reviewed and are negative.   PHYSICAL EXAM:   VS:  BP 134/72 (BP Location: Right Arm, Patient Position: Sitting)   Pulse 70   Ht 5\' 10"  (1.778 m)   Wt 159 lb 12.8 oz (72.5 kg)   BMI 22.93 kg/m    GEN: Well nourished, well developed, in no acute distress  HEENT: normal  Neck: no JVD, carotid bruits, or masses Cardiac: RRR; no murmurs, rubs, or gallops,no edema  Respiratory:  clear to auscultation bilaterally, normal work of breathing GI: soft, nontender, nondistended, + BS MS: no deformity or atrophy  Skin: warm and dry, no rash Neuro:  Alert and Oriented x 3, Strength and sensation are intact Psych: euthymic mood, full affect  Wt Readings from Last 3 Encounters:  12/19/17 159 lb 12.8 oz (72.5 kg)  10/16/16 174 lb (78.9 kg)  04/11/16 183 lb 3.2 oz (83.1 kg)      Studies/Labs Reviewed:   EKG:  EKG is ordered today.  The ekg ordered today demonstrates normal sinus rhythm with T wave inversion in lead III and aVF which appears to be more pronounced when compared to last year's EKG  Recent Labs: No results found for requested labs within last 8760 hours.   Lipid Panel No results found for: CHOL, TRIG, HDL, CHOLHDL, VLDL, LDLCALC, LDLDIRECT  Additional studies/ records that  were reviewed today include:   Myoview 03/28/2016 Study Highlights    Nuclear stress EF: 52%. The left ventricular ejection fraction is mildly decreased (45-54%).  This is a low risk study.  The study is normal.     ASSESSMENT:    1. Coronary artery disease involving coronary bypass graft of native heart with angina pectoris ([place])   2. Essential hypertension   3. Hyperlipidemia LDL goal <70   4. Controlled type 2 diabetes mellitus without complication, without long-term current use of insulin (H[facility])      PLAN:  In order of problems listed above:  1. CAD s/p CABG: He has chronic history of atypical chest pain that only lasts a few seconds at a time, the chest pain does not have strong correlation with exertion however he is not very active.  In the past 2 weeks, he has taken 1 nitroglycerin.  I recommend medical management for the time being by increasing his Imdur to 60 mg daily and reassessing in 3 months.  His last Myoview in 2018 was normal.  2. Hypertension: Blood pressure borderline elevated, will increase Imdur to 60 mg daily  3. Hyperlipidemia: Based on [facility], his last lipid panel was obtained in November 2018 which showed HDL 45, LDL borderline high at 76, total cholesterol 136 and a triglyceride 77.  He is due for repeat fasting lipid panel.  His daughter mentioned he just had lab work yet over a month ago, however I am unable to see through the [facility].  If his LDL remains elevated, likely will increase his Pravachol  4. DM2: Managed  by primary care provider.    Medication Adjustments/Labs and Tests Ordered: Current medicines are reviewed at length with the patient today.  Concerns regarding medicines are outlined above.  Medication changes, Labs and Tests ordered today are listed in the Patient Instructions below. Patient Instructions  Medication Instructions:  Increase IMDUR to 60 mg daily. RX sent to pharmacy. If you need a refill on your cardiac medications before your  next appointment, please call your pharmacy.   Lab work: None Ordered. If you have labs (blood work) drawn today and your tests are completely normal, you will receive your results only by: Marland Kitchen MyChart Message (if you have MyChart) OR . A paper copy in the mail If you have any lab test that is abnormal or we need to change your treatment, we will call you to review the results.  Testing/Procedures: None Ordered.  Follow-Up: At [facility], you and your health needs are our priority.  As part of our continuing mission to provide you with exceptional heart care, we have created designated Provider Care Teams.  These Care Teams include your primary Cardiologist (physician) and Advanced Practice Providers (APPs -  Physician Assistants and Nurse Practitioners) who all work together to provide you with the care you need, when you need it. You will need a follow up appointment in 3 months.  You may see Dr.Croitoru or one of the following Advanced Practice Providers on your designated Care Team: Almyra Deforest, [place] . Fabian Sharp, PA-C  Any Other Special Instructions Will Be Listed Below (If Applicable). None.      Richard [name], [place]  12/19/2017 9:23 AM    [address], [place], [place]  [address] Phone: [phone]; Fax: [phone]

## 2017-12-19 NOTE — Patient Instructions (Signed)
Medication Instructions:  Increase IMDUR to 60 mg daily. RX sent to pharmacy. If you need a refill on your cardiac medications before your next appointment, please call your pharmacy.   Lab work: None Ordered. If you have labs (blood work) drawn today and your tests are completely normal, you will receive your results only by: Marland Kitchen MyChart Message (if you have MyChart) OR . A paper copy in the mail If you have any lab test that is abnormal or we need to change your treatment, we will call you to review the results.  Testing/Procedures: None Ordered.  Follow-Up: At [facility], you and your health needs are our priority.  As part of our continuing mission to provide you with exceptional heart care, we have created designated Provider Care Teams.  These Care Teams include your primary Cardiologist (physician) and Advanced Practice Providers (APPs -  Physician Assistants and Nurse Practitioners) who all work together to provide you with the care you need, when you need it. You will need a follow up appointment in 3 months.  You may see Dr.Croitoru or one of the following Advanced Practice Providers on your designated Care Team: Almyra Deforest, [place] . Fabian Sharp, PA-C  Any Other Special Instructions Will Be Listed Below (If Applicable). None.

## 2017-12-27 ENCOUNTER — Other Ambulatory Visit: Payer: Self-pay | Admitting: Cardiovascular Disease

## 2018-02-11 ENCOUNTER — Encounter: Payer: Self-pay | Admitting: Cardiovascular Disease

## 2018-02-11 ENCOUNTER — Ambulatory Visit: Payer: Medicare Other | Admitting: Cardiovascular Disease

## 2018-02-11 VITALS — BP 104/76 | HR 86 | Ht 70.0 in | Wt 165.0 lb

## 2018-02-11 DIAGNOSIS — Z8639 Personal history of other endocrine, nutritional and metabolic disease: Secondary | ICD-10-CM | POA: Diagnosis not present

## 2018-02-11 DIAGNOSIS — I25118 Atherosclerotic heart disease of native coronary artery with other forms of angina pectoris: Secondary | ICD-10-CM

## 2018-02-11 DIAGNOSIS — E7800 Pure hypercholesterolemia, unspecified: Secondary | ICD-10-CM | POA: Diagnosis not present

## 2018-02-11 DIAGNOSIS — I10 Essential (primary) hypertension: Secondary | ICD-10-CM

## 2018-02-11 NOTE — Progress Notes (Signed)
Cardiology Office Note    Date:  02/12/2018   ID:  [name], DOB 21-Aug-1931, MRN [id number]  PCP:  Jani Gravel, MD  Cardiologist:   Sanda Klein, MD   Chief Complaint  Patient presents with  . Coronary Artery Disease    History of Present Illness:  Richard [name] is a 85 y.o. male with history of coronary disease with a myocardial infarction 2003 followed by multivessel bypass surgery (Dr. Cyndia Bent: LIMA to LAD, SVG to OM, sequential SVG to branches of ramus intermedius, SVG to RCA), with preserved left ventricular systolic function and mild hyperlipidemia, hypertension and borderline diabetes mellitus. He also has hyperuricemia and rheumatoid arthritis. His last functional study was a low risk nuclear stress test in 2013 that described diaphragmatic attenuation artifact and normal left ventricular systolic function.  Continues to have frequent but very brief episodes of sharp mid chest pain that resolved within less than a couple of seconds.  They are never exertional.  It did not sound like his pre-bypass angina.  She is very tender over his sternotomy scar which burns and stings.  He has some keloid formation.  His blood pressure is often very low and he has occasional dizziness with change of position.  The patient specifically denies any chest pain at rest or with exertion, dyspnea at rest or with exertion, orthopnea, paroxysmal nocturnal dyspnea, syncope, palpitations, focal neurological deficits, intermittent claudication, lower extremity edema, unexplained weight gain, cough, hemoptysis or wheezing.  The patient also denies abdominal pain, nausea, vomiting, dysphagia, diarrhea, constipation, polyuria, polydipsia, dysuria, hematuria, frequency, urgency, abnormal bleeding or bruising, fever, chills, unexpected weight changes, mood swings, change in skin or hair texture, change in voice quality, auditory or visual problems, allergic reactions or rashes, new musculoskeletal complaints  other than usual "aches and pains".   Past Medical History:  Diagnosis Date  . Barrett esophagus    history of  . CAD (coronary artery disease)    s/p MI in 2003  . Diabetes mellitus without complication ([place])    type 2  . Diverticulosis   . Hyperlipidemia   . Hypertension    systemic  . Insomnia   . Microalbuminuria   . Mild aortic insufficiency   . Mild diastolic dysfunction    slightly dilated left atrium  . Mild mitral regurgitation by prior echocardiogram 02/10/09  . Mitral valve prolapse    borderline  . Myocardial infarction ([place]) 2003  . Osteoarthritis   . Right inguinal hernia   . Tubular adenoma 2011   on colonoscopy   . Vitamin D deficiency     Past Surgical History:  Procedure Laterality Date  . APPENDECTOMY    . CARDIAC SURGERY  2005   quadruple bypass  . CATARACT EXTRACTION  12/2007, 01/16/2008   right in 2009, left in 2010  . CORONARY ARTERY BYPASS GRAFT  2003   05  . ESOPHAGOGASTRODUODENOSCOPY  09/13/2005   stricture and dilation  . EYE SURGERY     Dr. Tommi Emery  . HERNIA REPAIR    . INGUINAL HERNIA REPAIR Right 03/13/2012   Procedure: HERNIA REPAIR INGUINAL ADULT;  Surgeon: Adin Hector, MD;  Location: [facility];  Service: General;  Laterality: Right;  . INSERTION OF MESH Right 03/13/2012   Procedure: INSERTION OF MESH;  Surgeon: Adin Hector, MD;  Location: [facility];  Service: General;  Laterality: Right;  . Nuclear stress test  03/2011   diaphragmatic attenuation artifact No ischemia  . PENILE PROSTHESIS IMPLANT  10/10/2000  . TIBIA FRACTURE SURGERY     steel plate insertion  . TONSILLECTOMY    . TOTAL KNEE ARTHROPLASTY     left    Current Medications: Outpatient Medications Prior to Visit  Medication Sig Dispense Refill  . allopurinol (ZYLOPRIM) 100 MG tablet Take 1 tablet (100 mg total) by mouth daily. 90 tablet 3  . amLODipine (NORVASC) 10 MG tablet TAKE 1 TABLET BY MOUTH EVERY DAY 90 tablet 0  . aspirin EC 81 MG tablet Take 81 mg by  mouth daily.    . carvedilol (COREG) 3.125 MG tablet Take 1 tablet (3.125 mg total) by mouth 2 (two) times daily. 180 tablet 3  . Cholecalciferol (VITAMIN D3) 1000 UNITS CAPS Take 1,000 Units by mouth daily.    Marland Kitchen gabapentin (NEURONTIN) 300 MG capsule Take 300 mg by mouth 3 (three) times daily.  4  . isosorbide mononitrate (IMDUR) 60 MG 24 hr tablet TAKE 1 TABLET(30 MG) BY MOUTH DAILY 90 tablet 1  . metFORMIN (GLUCOPHAGE) 1000 MG tablet Take 1 tablet by mouth 2 (two) times daily.  3  . pravastatin (PRAVACHOL) 40 MG tablet Take 40 mg by mouth daily. Pt take1 tablet and a half tablet once daily  12  . spironolactone (ALDACTONE) 25 MG tablet Take 25 mg by mouth Daily.     . nitroGLYCERIN (NITROSTAT) 0.4 MG SL tablet Place 1 tablet (0.4 mg total) under the tongue every 5 (five) minutes as needed for chest pain. 25 tablet 2   No facility-administered medications prior to visit.      Allergies:   Patient has no known allergies.   Social History   Socioeconomic History  . Marital status: Married    Spouse name: Not on file  . Number of children: Not on file  . Years of education: Not on file  . Highest education level: Not on file  Occupational History  . Not on file  Social Needs  . Financial resource strain: Not on file  . Food insecurity:    Worry: Not on file    Inability: Not on file  . Transportation needs:    Medical: Not on file    Non-medical: Not on file  Tobacco Use  . Smoking status: Former Smoker    Packs/day: 1.00    Years: 50.00    Pack years: 50.00    Types: Cigarettes    Last attempt to quit: 02/08/2000    Years since quitting: 18.0  . Smokeless tobacco: Never Used  Substance and Sexual Activity  . Alcohol use: No  . Drug use: No  . Sexual activity: Not on file  Lifestyle  . Physical activity:    Days per week: Not on file    Minutes per session: Not on file  . Stress: Not on file  Relationships  . Social connections:    Talks on phone: Not on file     Gets together: Not on file    Attends religious service: Not on file    Active member of club or organization: Not on file    Attends meetings of clubs or organizations: Not on file    Relationship status: Not on file  Other Topics Concern  . Not on file  Social History Narrative  . Not on file     Family History:  The patient's family history includes Alzheimer's disease in his mother; Cancer in his father; Dementia (age of onset: 32) in his mother.   ROS:   Please  see the history of present illness.    ROS all other systems are reviewed and are negative   PHYSICAL EXAM:   VS:  BP 104/76   Pulse 86   Ht 5\' 10"  (1.778 m)   Wt 165 lb (74.8 kg)   BMI 23.68 kg/m      General: Alert, oriented x3, no distress, prominent keloid scar specialist about one half of the sternotomy Head: no evidence of trauma, PERRL, EOMI, no exophtalmos or lid lag, no myxedema, no xanthelasma; normal ears, nose and oropharynx Neck: normal jugular venous pulsations and no hepatojugular reflux; brisk carotid pulses without delay and no carotid bruits Chest: clear to auscultation, no signs of consolidation by percussion or palpation, normal fremitus, symmetrical and full respiratory excursions Cardiovascular: normal position and quality of the apical impulse, regular rhythm, normal first and second heart sounds, no murmurs, rubs or gallops Abdomen: no tenderness or distention, no masses by palpation, no abnormal pulsatility or arterial bruits, normal bowel sounds, no hepatosplenomegaly Extremities: no clubbing, cyanosis or edema; 2+ radial, ulnar and brachial pulses bilaterally; 2+ right femoral, posterior tibial and dorsalis pedis pulses; 2+ left femoral, posterior tibial and dorsalis pedis pulses; no subclavian or femoral bruits Neurological: grossly nonfocal Psych: Normal mood and affect   Wt Readings from Last 3 Encounters:  02/11/18 165 lb (74.8 kg)  12/19/17 159 lb 12.8 oz (72.5 kg)  10/16/16 174 lb  (78.9 kg)      Studies/Labs Reviewed:   EKG:  EKG is ordered today.  Sinus rhythm with a single PAC, nonspecific intraventricular conduction delay almost meeting criteria for left bundle branch block, QRS 122 ms, QTC 442 ms.  Minor ST-T changes in lead I and aVL.  Recent Labs: April-October 2019 hemoglobin 12.1, normal LFTs, creatinine 1.22, potassium 4.8, glucose 91, hemoglobin A1c 5.5% Lipid Panel February 2018 total cholesterol 147, HDL 51, LDL 76, triglycerides 86   ASSESSMENT:    1. Coronary artery disease of native artery of native heart with stable angina pectoris ([place])   2. Hypercholesterolemia   3. History of diabetes mellitus   4. Essential hypertension     PLAN:  In order of problems listed above:  1. CAD: He is currently asymptomatic on 3 different antianginal medications.  17 years have passed since bypass surgery.  Beta-blocker was previously stopped due to bradycardia, but then angina recurred.  He is now on a low-dose of carvedilol and his heart rate is in the 80s. 2. HLP: Excellent lipid parameters. 3. DM: Well-controlled, last hemoglobin A1c completely normal at 5.5% without any medications. 4. HTN: Blood pressure is relatively low.  I think we can stop the spironolactone.    Medication Adjustments/Labs and Tests Ordered: Current medicines are reviewed at length with the patient today.  Concerns regarding medicines are outlined above.  Medication changes, Labs and Tests ordered today are listed in the Patient Instructions below. Patient Instructions  Medication Instructions:  STOP Spironolactone  If you need a refill on your cardiac medications before your next appointment, please call your pharmacy.   Lab work: None  If you have labs (blood work) drawn today and your tests are completely normal, you will receive your results only by: Marland Kitchen MyChart Message (if you have MyChart) OR . A paper copy in the mail If you have any lab test that is abnormal or we need  to change your treatment, we will call you to review the results.  Testing/Procedures: None  Follow-Up: At [facility], you and your  health needs are our priority.  As part of our continuing mission to provide you with exceptional heart care, we have created designated Provider Care Teams.  These Care Teams include your primary Cardiologist (physician) and Advanced Practice Providers (APPs -  Physician Assistants and Nurse Practitioners) who all work together to provide you with the care you need, when you need it. You will need a follow up appointment in 12 months.  Please call our office 2 months (December 2020) in advance to schedule this appointment.  You may see Sanda Klein, MD or one of the following Advanced Practice Providers on your designated Care Team:   Kerin Ransom, PA-C [place], [place] . Sande Rives, PA-C  Any Other Special Instructions Will Be Listed Below (If Applicable).      Signed, Sanda Klein, MD  02/12/2018 11:35 AM    [address], [place], [place]  [address] Phone: [phone]; Fax: [phone]

## 2018-02-11 NOTE — Patient Instructions (Signed)
Medication Instructions:  STOP Spironolactone  If you need a refill on your cardiac medications before your next appointment, please call your pharmacy.   Lab work: None  If you have labs (blood work) drawn today and your tests are completely normal, you will receive your results only by: Marland Kitchen MyChart Message (if you have MyChart) OR . A paper copy in the mail If you have any lab test that is abnormal or we need to change your treatment, we will call you to review the results.  Testing/Procedures: None  Follow-Up: At [facility], you and your health needs are our priority.  As part of our continuing mission to provide you with exceptional heart care, we have created designated Provider Care Teams.  These Care Teams include your primary Cardiologist (physician) and Advanced Practice Providers (APPs -  Physician Assistants and Nurse Practitioners) who all work together to provide you with the care you need, when you need it. You will need a follow up appointment in 12 months.  Please call our office 2 months (December 2020) in advance to schedule this appointment.  You may see Sanda Klein, MD or one of the following Advanced Practice Providers on your designated Care Team:   Kerin Ransom, PA-C [place], [place] . Sande Rives, PA-C  Any Other Special Instructions Will Be Listed Below (If Applicable).

## 2018-02-12 ENCOUNTER — Encounter: Payer: Self-pay | Admitting: Cardiovascular Disease

## 2018-02-25 ENCOUNTER — Other Ambulatory Visit: Payer: Self-pay | Admitting: Cardiovascular Disease

## 2018-02-25 NOTE — Telephone Encounter (Signed)
Rx(s) sent to pharmacy electronically.  

## 2018-05-09 DIAGNOSIS — E119 Type 2 diabetes mellitus without complications: Secondary | ICD-10-CM | POA: Diagnosis not present

## 2018-05-09 DIAGNOSIS — Z0000 Encounter for general adult medical examination without abnormal findings: Secondary | ICD-10-CM | POA: Diagnosis not present

## 2018-05-16 DIAGNOSIS — I2510 Atherosclerotic heart disease of native coronary artery without angina pectoris: Secondary | ICD-10-CM | POA: Diagnosis not present

## 2018-05-16 DIAGNOSIS — E119 Type 2 diabetes mellitus without complications: Secondary | ICD-10-CM | POA: Diagnosis not present

## 2018-05-16 DIAGNOSIS — Z0000 Encounter for general adult medical examination without abnormal findings: Secondary | ICD-10-CM | POA: Diagnosis not present

## 2018-05-16 DIAGNOSIS — G4700 Insomnia, unspecified: Secondary | ICD-10-CM | POA: Diagnosis not present

## 2018-07-21 ENCOUNTER — Other Ambulatory Visit: Payer: Self-pay | Admitting: Physician Assistant

## 2018-08-22 DIAGNOSIS — H6121 Impacted cerumen, right ear: Secondary | ICD-10-CM | POA: Diagnosis not present

## 2018-08-22 DIAGNOSIS — R6889 Other general symptoms and signs: Secondary | ICD-10-CM | POA: Diagnosis not present

## 2018-09-12 DIAGNOSIS — H66003 Acute suppurative otitis media without spontaneous rupture of ear drum, bilateral: Secondary | ICD-10-CM | POA: Diagnosis not present

## 2018-09-12 DIAGNOSIS — H6123 Impacted cerumen, bilateral: Secondary | ICD-10-CM | POA: Diagnosis not present

## 2018-09-24 DIAGNOSIS — H6521 Chronic serous otitis media, right ear: Secondary | ICD-10-CM | POA: Diagnosis not present

## 2018-10-08 DIAGNOSIS — H66003 Acute suppurative otitis media without spontaneous rupture of ear drum, bilateral: Secondary | ICD-10-CM | POA: Diagnosis not present

## 2018-10-31 ENCOUNTER — Encounter (INDEPENDENT_AMBULATORY_CARE_PROVIDER_SITE_OTHER): Payer: Self-pay

## 2018-11-01 DIAGNOSIS — E119 Type 2 diabetes mellitus without complications: Secondary | ICD-10-CM | POA: Diagnosis not present

## 2018-11-01 DIAGNOSIS — Z0000 Encounter for general adult medical examination without abnormal findings: Secondary | ICD-10-CM | POA: Diagnosis not present

## 2018-11-01 DIAGNOSIS — R972 Elevated prostate specific antigen [PSA]: Secondary | ICD-10-CM | POA: Diagnosis not present

## 2018-11-01 DIAGNOSIS — H9201 Otalgia, right ear: Secondary | ICD-10-CM | POA: Diagnosis not present

## 2018-11-05 DIAGNOSIS — H60333 Swimmer's ear, bilateral: Secondary | ICD-10-CM | POA: Diagnosis not present

## 2018-11-05 DIAGNOSIS — H66003 Acute suppurative otitis media without spontaneous rupture of ear drum, bilateral: Secondary | ICD-10-CM | POA: Diagnosis not present

## 2018-11-07 DIAGNOSIS — R972 Elevated prostate specific antigen [PSA]: Secondary | ICD-10-CM | POA: Diagnosis not present

## 2018-11-07 DIAGNOSIS — N390 Urinary tract infection, site not specified: Secondary | ICD-10-CM | POA: Diagnosis not present

## 2018-11-07 DIAGNOSIS — Z13820 Encounter for screening for osteoporosis: Secondary | ICD-10-CM | POA: Diagnosis not present

## 2018-11-07 DIAGNOSIS — E119 Type 2 diabetes mellitus without complications: Secondary | ICD-10-CM | POA: Diagnosis not present

## 2018-11-07 DIAGNOSIS — M109 Gout, unspecified: Secondary | ICD-10-CM | POA: Diagnosis not present

## 2018-11-07 DIAGNOSIS — I2510 Atherosclerotic heart disease of native coronary artery without angina pectoris: Secondary | ICD-10-CM | POA: Diagnosis not present

## 2018-11-19 DIAGNOSIS — R972 Elevated prostate specific antigen [PSA]: Secondary | ICD-10-CM | POA: Diagnosis not present

## 2018-11-19 DIAGNOSIS — E1142 Type 2 diabetes mellitus with diabetic polyneuropathy: Secondary | ICD-10-CM | POA: Diagnosis not present

## 2018-11-19 DIAGNOSIS — Z0000 Encounter for general adult medical examination without abnormal findings: Secondary | ICD-10-CM | POA: Diagnosis not present

## 2018-11-19 DIAGNOSIS — E7800 Pure hypercholesterolemia, unspecified: Secondary | ICD-10-CM | POA: Diagnosis not present

## 2018-12-07 ENCOUNTER — Emergency Department (HOSPITAL_COMMUNITY): Payer: Medicare Other

## 2018-12-07 ENCOUNTER — Emergency Department (HOSPITAL_COMMUNITY)
Admission: EM | Admit: 2018-12-07 | Discharge: 2018-12-08 | Disposition: A | Discharge status: Home / Self Care | Payer: Medicare Other | Attending: Emergency Medicine | Admitting: Emergency Medicine

## 2018-12-07 ENCOUNTER — Other Ambulatory Visit: Payer: Self-pay

## 2018-12-07 DIAGNOSIS — J029 Acute pharyngitis, unspecified: Secondary | ICD-10-CM | POA: Diagnosis present

## 2018-12-07 DIAGNOSIS — E119 Type 2 diabetes mellitus without complications: Secondary | ICD-10-CM | POA: Insufficient documentation

## 2018-12-07 DIAGNOSIS — Z7984 Long term (current) use of oral hypoglycemic drugs: Secondary | ICD-10-CM | POA: Insufficient documentation

## 2018-12-07 DIAGNOSIS — Z951 Presence of aortocoronary bypass graft: Secondary | ICD-10-CM | POA: Diagnosis not present

## 2018-12-07 DIAGNOSIS — Z87891 Personal history of nicotine dependence: Secondary | ICD-10-CM | POA: Insufficient documentation

## 2018-12-07 DIAGNOSIS — Z79899 Other long term (current) drug therapy: Secondary | ICD-10-CM | POA: Diagnosis not present

## 2018-12-07 DIAGNOSIS — J392 Other diseases of pharynx: Secondary | ICD-10-CM

## 2018-12-07 DIAGNOSIS — I10 Essential (primary) hypertension: Secondary | ICD-10-CM | POA: Diagnosis not present

## 2018-12-07 DIAGNOSIS — H9201 Otalgia, right ear: Secondary | ICD-10-CM

## 2018-12-07 DIAGNOSIS — Z7982 Long term (current) use of aspirin: Secondary | ICD-10-CM | POA: Diagnosis not present

## 2018-12-07 DIAGNOSIS — Z96652 Presence of left artificial knee joint: Secondary | ICD-10-CM | POA: Diagnosis not present

## 2018-12-07 DIAGNOSIS — M069 Rheumatoid arthritis, unspecified: Secondary | ICD-10-CM | POA: Insufficient documentation

## 2018-12-07 DIAGNOSIS — C119 Malignant neoplasm of nasopharynx, unspecified: Secondary | ICD-10-CM | POA: Diagnosis not present

## 2018-12-07 LAB — I-STAT CHEM 8, ED
BUN: 22 mg/dL (ref 8–23)
Calcium, Ion: 1.03 mmol/L — ABNORMAL LOW (ref 1.15–1.40)
Chloride: 100 mmol/L (ref 98–111)
Creatinine, Ser: 1.1 mg/dL (ref 0.61–1.24)
Glucose, Bld: 92 mg/dL (ref 70–99)
HCT: 32 % — ABNORMAL LOW (ref 39.0–52.0)
Hemoglobin: 10.9 g/dL — ABNORMAL LOW (ref 13.0–17.0)
Potassium: 3.9 mmol/L (ref 3.5–5.1)
Sodium: 136 mmol/L (ref 135–145)
TCO2: 26 mmol/L (ref 22–32)

## 2018-12-07 LAB — CBC WITH DIFFERENTIAL/PLATELET
Abs Immature Granulocytes: 0.01 10*3/uL (ref 0.00–0.07)
Basophils Absolute: 0 10*3/uL (ref 0.0–0.1)
Basophils Relative: 1 %
Eosinophils Absolute: 0 10*3/uL (ref 0.0–0.5)
Eosinophils Relative: 1 %
HCT: 31.9 % — ABNORMAL LOW (ref 39.0–52.0)
Hemoglobin: 10.3 g/dL — ABNORMAL LOW (ref 13.0–17.0)
Immature Granulocytes: 0 %
Lymphocytes Relative: 33 %
Lymphs Abs: 2.1 10*3/uL (ref 0.7–4.0)
MCH: 28.5 pg (ref 26.0–34.0)
MCHC: 32.3 g/dL (ref 30.0–36.0)
MCV: 88.1 fL (ref 80.0–100.0)
Monocytes Absolute: 0.5 10*3/uL (ref 0.1–1.0)
Monocytes Relative: 7 %
Neutro Abs: 3.7 10*3/uL (ref 1.7–7.7)
Neutrophils Relative %: 58 %
Platelets: 281 10*3/uL (ref 150–400)
RBC: 3.62 MIL/uL — ABNORMAL LOW (ref 4.22–5.81)
RDW: 13.2 % (ref 11.5–15.5)
WBC: 6.3 10*3/uL (ref 4.0–10.5)
nRBC: 0 % (ref 0.0–0.2)

## 2018-12-07 MED ORDER — IOHEXOL 300 MG/ML  SOLN
75.0000 mL | Freq: Once | INTRAMUSCULAR | Status: AC | PRN
  Administered 2018-12-07: 75 mL via INTRAVENOUS

## 2018-12-07 NOTE — ED Triage Notes (Signed)
Pt brought in by daughter with patient having complaints of sore throat, right ear pain, and headache.  Pt states symptoms started 6 weeks ago

## 2018-12-07 NOTE — ED Provider Notes (Signed)
Care assumed from Dr. Eulis Foster, please see his note for full details, but in brief Richard [name] is a 85 y.o. male who presented with 6 weeks of right ear pain, sore throat and headache.  Patient's lab work was reassuring, CT soft tissue neck with contrast pending at shift change to evaluate for potential infection or abscess.  If normal document feels that the patient can go home with symptomatic treatment.  Patient is followed by Dr. Lucia Gaskins with ENT and recently had a right-sided tympanostomy tube placed for chronic infections, this tube appears to be occluded with cerumen, but will refer to ENT for resolution of this.  Labs Reviewed  CBC WITH DIFFERENTIAL/PLATELET - Abnormal; Notable for the following components:      Result Value   RBC 3.62 (*)    Hemoglobin 10.3 (*)    HCT 31.9 (*)    All other components within normal limits  I-STAT CHEM 8, ED - Abnormal; Notable for the following components:   Calcium, Ion 1.03 (*)    Hemoglobin 10.9 (*)    HCT 32.0 (*)    All other components within normal limits   Ct Soft Tissue Neck W Contrast  Result Date: 12/07/2018 CLINICAL DATA:  Sore throat with right ear pain and headache started 6 weeks ago EXAM: CT NECK WITH CONTRAST TECHNIQUE: Multidetector CT imaging of the neck was performed using the standard protocol following the bolus administration of intravenous contrast. CONTRAST:  38mL OMNIPAQUE IOHEXOL 300 MG/ML  SOLN COMPARISON:  None. FINDINGS: Pharynx and larynx: Masslike thickening in the midline and right nasopharynx which extends to the level of the carotid sheath. No enhancing IJ is seen on the right, with non enhancement of the right transverse and sigmoid sinuses. There is mild cortical irregularity involving the inferior surface of the clivus. Small defect is also seen at the floor of the right sphenoid sinus. Salivary glands: Atrophic submandibular glands. Thyroid: Subcentimeter nodules in the left more than right thyroid, incidental. Lymph  nodes: None enlarged or abnormal density. Vascular: Right IJ findings as noted above. Limited intracranial: No evidence of intracranial spread. Fetal type bilateral PCA. 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 Visualized orbits: Bilateral cataract resection. Remote blowout fracture of the right orbital floor. Mastoids and visualized paranasal sinuses: Right mastoid opacification attributed to eustachian tube dysfunction. Skeleton: As above. No evidence of hematogenous metastasis. There is extensive cervical and upper thoracic spine degeneration with multilevel bridging ossification of the facets. Upper chest: Negative IMPRESSION: 1. 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 An erosion through the floor of the right sphenoid sinus could also be tumoral. Negative for adenopathy. Neck MRI would likely be contributory. 2. Non-opacified upper right internal jugular vein and right transverse/sigmoid dural venous sinuses, presumably related to #1. 3. Right mastoid opacification attributed to eustachian tube dysfunction. Electronically Signed   By: Monte Fantasia M.D.   On: 12/07/2018 21:36   MDM  CT soft tissue neck shows a right-sided nasopharyngeal carcinoma with mass extending into the right upper carotid she and likely causing erosion of the cleavage cortex as well as through the floor of the right sphenoid sinus that could also be tomorrow.  Will attempt to discuss these results with Dr. Lucia Gaskins patient's ENT.  Attempted to reach Dr. Lucia Gaskins with ENT multiple times but he did not return pages.  Case discussed  with Dr. Benjamine Mola on-call for general ENT who reviewed patient's scans, there is no need for acute intervention and patient is stable for discharge home and close follow-up with his ENT outpatient.  Discussed this with patient and  his daughter.  Patient will be discharged home in good condition.  Return precautions discussed.  Patient family expressed understanding and agreement.  Final diagnoses:  Nasopharyngeal mass  Sore throat  Right ear pain      Janet Berlin 12/07/18 2358    Charlesetta Shanks, MD 12/23/18 [phone]

## 2018-12-07 NOTE — Discharge Instructions (Signed)
Please call to schedule follow-up with Dr. Lucia Gaskins regarding nasopharyngeal mass, this will need to be biopsied to help determine what is causing this mass.  Could potentially be concerning for cancer.  Please call Dr. Pollie Friar office Monday morning to schedule follow-up appointment.  Return to the ED for new or worsening symptoms.

## 2018-12-07 NOTE — ED Provider Notes (Signed)
[place] [facility] EMERGENCY DEPARTMENT Provider Note   CSN: [id number] Arrival date & time: 12/07/18  1656     History   Chief Complaint Chief Complaint  Patient presents with  . Headache  . Otalgia  . Sore Throat    HPI Richard [name] is a 85 y.o. male.     HPI   Patient brought in by family member, daughter, with report that he is complaining of sore throat, right ear pain and headache.  Symptoms are ongoing for six weeks.  Patient and daughter report that 4 weeks ago he had a ventilating tube placed in the right tympanic membrane for chronic ear infection.  He has not been back to see his ENT doctor since then.  There has been no fever, chills, weakness or dizziness.  There is been no cough or chest pain.  There are no other known modifying factors.  Past Medical History:  Diagnosis Date  . Barrett esophagus    history of  . CAD (coronary artery disease)    s/p MI in 2003  . Diabetes mellitus without complication ([place])    type 2  . Diverticulosis   . Hyperlipidemia   . Hypertension    systemic  . Insomnia   . Microalbuminuria   . Mild aortic insufficiency   . Mild diastolic dysfunction    slightly dilated left atrium  . Mild mitral regurgitation by prior echocardiogram 02/10/09  . Mitral valve prolapse    borderline  . Myocardial infarction ([place]) 2003  . Osteoarthritis   . Right inguinal hernia   . Tubular adenoma 2011   on colonoscopy   . Vitamin D deficiency     Patient Active Problem List   Diagnosis Date Noted  . Coronary artery disease of native artery of native heart with stable angina pectoris ([place]) 10/16/2016  . Chest pain with moderate risk of acute coronary syndrome 04/11/2016  . Sinus bradycardia 11/04/2015  . Non-insulin treated type 2 diabetes mellitus ([place]) 11/04/2015  . Hx of CABG 10/29/2013  . Essential hypertension 10/29/2013  . Dyslipidemia 10/29/2013  . RA (rheumatoid arthritis) ([place]) 10/29/2013  . Right inguinal hernia  02/08/2012    Past Surgical History:  Procedure Laterality Date  . APPENDECTOMY    . CARDIAC SURGERY  2005   quadruple bypass  . CATARACT EXTRACTION  12/2007, 01/16/2008   right in 2009, left in 2010  . CORONARY ARTERY BYPASS GRAFT  2003   05  . ESOPHAGOGASTRODUODENOSCOPY  09/13/2005   stricture and dilation  . EYE SURGERY     Dr. Tommi Emery  . HERNIA REPAIR    . INGUINAL HERNIA REPAIR Right 03/13/2012   Procedure: HERNIA REPAIR INGUINAL ADULT;  Surgeon: Adin Hector, MD;  Location: [facility];  Service: General;  Laterality: Right;  . INSERTION OF MESH Right 03/13/2012   Procedure: INSERTION OF MESH;  Surgeon: Adin Hector, MD;  Location: [facility];  Service: General;  Laterality: Right;  . Nuclear stress test  03/2011   diaphragmatic attenuation artifact No ischemia  . PENILE PROSTHESIS IMPLANT  10/10/2000  . TIBIA FRACTURE SURGERY     steel plate insertion  . TONSILLECTOMY    . TOTAL KNEE ARTHROPLASTY     left        Home Medications    Prior to Admission medications   Medication Sig Start Date End Date Taking? Authorizing Provider  allopurinol (ZYLOPRIM) 100 MG tablet Take 1 tablet (100 mg total) by mouth daily. 10/16/16  Croitoru, Mihai, MD  amLODipine (NORVASC) 10 MG tablet Take 1 tablet (10 mg total) by mouth daily. 02/25/18   Croitoru, Mihai, MD  aspirin EC 81 MG tablet Take 81 mg by mouth daily.    [provider]  carvedilol (COREG) 3.125 MG tablet Take 1 tablet (3.125 mg total) by mouth 2 (two) times daily. 11/04/15   Croitoru, Mihai, MD  Cholecalciferol (VITAMIN D3) 1000 UNITS CAPS Take 1,000 Units by mouth daily.    [provider]  gabapentin (NEURONTIN) 300 MG capsule Take 300 mg by mouth 3 (three) times daily. 03/18/16   [provider]  isosorbide mononitrate (IMDUR) 60 MG 24 hr tablet TAKE 1 TABLET BY MOUTH EVERY DAY 07/22/18   Croitoru, Mihai, MD  metFORMIN (GLUCOPHAGE) 1000 MG tablet Take 1 tablet by mouth 2 (two) times daily. 10/04/15    [provider]  nitroGLYCERIN (NITROSTAT) 0.4 MG SL tablet Place 1 tablet (0.4 mg total) under the tongue every 5 (five) minutes as needed for chest pain. 03/20/16 06/18/16  Croitoru, Mihai, MD  pravastatin (PRAVACHOL) 40 MG tablet Take 40 mg by mouth daily. Pt take1 tablet and a half tablet once daily 09/29/14   [provider]    Family History Family History  Problem Relation Age of Onset  . Alzheimer's disease Mother   . Dementia Mother 28  . Cancer Father        lung, metastatic to brain    Social History Social History   Tobacco Use  . Smoking status: Former Smoker    Packs/day: 1.00    Years: 50.00    Pack years: 50.00    Types: Cigarettes    Quit date: 02/08/2000    Years since quitting: 18.8  . Smokeless tobacco: Never Used  Substance Use Topics  . Alcohol use: No  . Drug use: No     Allergies   Patient has no known allergies.   Review of Systems Review of Systems  All other systems reviewed and are negative.    Physical Exam Updated Vital Signs BP (!) 166/71   Pulse 74   Temp 98.5 F (36.9 C) (Oral)   Resp 16   Ht 5\' 8"  (1.727 m)   Wt 72.6 kg   SpO2 99%   BMI 24.33 kg/m   Physical Exam Vitals signs and nursing note reviewed.  Constitutional:      General: He is not in acute distress.    Appearance: He is well-developed. He is not ill-appearing, toxic-appearing or diaphoretic.  HENT:     Head: Normocephalic and atraumatic.     Right Ear: External ear normal.     Left Ear: External ear normal.     Ears:     Comments: Right external auditory canal is occluded by cerumen.  No blood or pus in the canal.  No tenderness or swelling around the right ear.  Hearing aid is in left external auditory canal.  Left ear is nontender.    Mouth/Throat:     Comments: Minimal swelling, right pharynx, no visible tonsillar tissue.  No trismus.  Airway intact. Eyes:     Conjunctiva/sclera: Conjunctivae normal.     Pupils: Pupils are equal,  round, and reactive to light.  Neck:     Musculoskeletal: Normal range of motion and neck supple.     Trachea: Phonation normal.  Cardiovascular:     Rate and Rhythm: Normal rate and regular rhythm.     Heart sounds: Normal heart sounds.  Pulmonary:  Effort: Pulmonary effort is normal.     Breath sounds: Normal breath sounds.  Abdominal:     Palpations: Abdomen is soft.     Tenderness: There is no abdominal tenderness.  Musculoskeletal: Normal range of motion.  Skin:    General: Skin is warm and dry.  Neurological:     Mental Status: He is alert and oriented to person, place, and time.     Cranial Nerves: No cranial nerve deficit.     Sensory: No sensory deficit.     Motor: No abnormal muscle tone.     Coordination: Coordination normal.  Psychiatric:        Mood and Affect: Mood normal.        Behavior: Behavior normal.        Thought Content: Thought content normal.        Judgment: Judgment normal.      ED Treatments / Results  Labs (all labs ordered are listed, but only abnormal results are displayed) Labs Reviewed  CBC WITH DIFFERENTIAL/PLATELET - Abnormal; Notable for the following components:      Result Value   RBC 3.62 (*)    Hemoglobin 10.3 (*)    HCT 31.9 (*)    All other components within normal limits  I-STAT CHEM 8, ED - Abnormal; Notable for the following components:   Calcium, Ion 1.03 (*)    Hemoglobin 10.9 (*)    HCT 32.0 (*)    All other components within normal limits    EKG None  Radiology No results found.  Procedures Procedures (including critical care time)  Medications Ordered in ED Medications  iohexol (OMNIPAQUE) 300 MG/ML solution 75 mL (75 mLs Intravenous Contrast Given 12/07/18 2108)     Initial Impression / Assessment and Plan / ED Course  I have reviewed the triage vital signs and the nursing notes.  Pertinent labs & imaging results that were available during my care of the patient were reviewed by me and considered  in my medical decision making (see chart for details).  Clinical Course as of Dec 06 2124  Sat Dec 07, 2018  2121 Normal except hemoglobin low  CBC with Differential(!) [EW]  2121 Normal except calcium low, hemoglobin low  I-stat chem 8, ED (not at [facility] or [facility])(!) [EW]    Clinical Course User Index [EW] Daleen Bo, MD        Patient Vitals for the past 24 hrs:  BP Temp Temp src Pulse Resp SpO2 Height Weight  12/07/18 1726 - - - - - - 5\' 8"  (1.727 m) 72.6 kg  12/07/18 1720 - 98.5 F (36.9 C) Oral - - - - -  12/07/18 1715 (!) 166/71 - - 74 16 99 % - -    Medical Decision Making: Sore throat and right ear pain, nonspecific symptoms.  Recent ventilating tube right TM.  Right TM occluded by cerumen, that cannot be removed by irrigation, due to possible open middle ear cavity.  She is nontoxic.  CT imaging ordered to evaluate for posterior pharyngeal abscess.  Scan that returned as of 9:15 PM care transferred to oncoming provider team.  CRITICAL CARE-no Performed by: Daleen Bo  Nursing Notes Reviewed/ Care Coordinated Applicable Imaging Reviewed Interpretation of Laboratory Data incorporated into ED treatment  Plan-disposition following return of CT imaging.  Final Clinical Impressions(s) / ED Diagnoses   Final diagnoses:  Sore throat  Right ear pain    ED Discharge Orders    None  Daleen Bo, MD 12/07/18 2126

## 2018-12-08 NOTE — ED Notes (Signed)
Patient verbalizes understanding of discharge instructions. Opportunity for questioning and answers were provided. Armband removed by staff, pt discharged from ED.  

## 2018-12-10 ENCOUNTER — Ambulatory Visit (INDEPENDENT_AMBULATORY_CARE_PROVIDER_SITE_OTHER): Payer: Medicare Other | Admitting: Otolaryngology

## 2018-12-10 ENCOUNTER — Encounter (INDEPENDENT_AMBULATORY_CARE_PROVIDER_SITE_OTHER): Payer: Self-pay | Admitting: Otolaryngology

## 2018-12-10 ENCOUNTER — Other Ambulatory Visit: Payer: Self-pay

## 2018-12-10 VITALS — Temp 97.2°F

## 2018-12-10 DIAGNOSIS — D3705 Neoplasm of uncertain behavior of pharynx: Secondary | ICD-10-CM | POA: Diagnosis not present

## 2018-12-10 NOTE — Progress Notes (Addendum)
HPI: Richard [name] is a 85 y.o. male who returns today for evaluation of nasopharyngeal mass found recently on CT scan.  Patient was apparently having pain in the right ear and he underwent a CT scan that showed a right nasopharyngeal mass that had eroded into surrounding bone. He had a previous right M&T by myself 3 months ago..  Past Medical History:  Diagnosis Date  . Barrett esophagus    history of  . CAD (coronary artery disease)    s/p MI in 2003  . Diabetes mellitus without complication ([place])    type 2  . Diverticulosis   . Hyperlipidemia   . Hypertension    systemic  . Insomnia   . Microalbuminuria   . Mild aortic insufficiency   . Mild diastolic dysfunction    slightly dilated left atrium  . Mild mitral regurgitation by prior echocardiogram 02/10/09  . Mitral valve prolapse    borderline  . Myocardial infarction ([place]) 2003  . Osteoarthritis   . Right inguinal hernia   . Tubular adenoma 2011   on colonoscopy   . Vitamin D deficiency    Past Surgical History:  Procedure Laterality Date  . APPENDECTOMY    . CARDIAC SURGERY  2005   quadruple bypass  . CATARACT EXTRACTION  12/2007, 01/16/2008   right in 2009, left in 2010  . CORONARY ARTERY BYPASS GRAFT  2003   05  . ESOPHAGOGASTRODUODENOSCOPY  09/13/2005   stricture and dilation  . EYE SURGERY     Dr. Tommi Emery  . HERNIA REPAIR    . INGUINAL HERNIA REPAIR Right 03/13/2012   Procedure: HERNIA REPAIR INGUINAL ADULT;  Surgeon: Adin Hector, MD;  Location: [facility];  Service: General;  Laterality: Right;  . INSERTION OF MESH Right 03/13/2012   Procedure: INSERTION OF MESH;  Surgeon: Adin Hector, MD;  Location: [facility];  Service: General;  Laterality: Right;  . Nuclear stress test  03/2011   diaphragmatic attenuation artifact No ischemia  . PENILE PROSTHESIS IMPLANT  10/10/2000  . TIBIA FRACTURE SURGERY     steel plate insertion  . TONSILLECTOMY    . TOTAL KNEE ARTHROPLASTY     left   Social History    Socioeconomic History  . Marital status: Married    Spouse name: Not on file  . Number of children: Not on file  . Years of education: Not on file  . Highest education level: Not on file  Occupational History  . Not on file  Social Needs  . Financial resource strain: Not on file  . Food insecurity    Worry: Not on file    Inability: Not on file  . Transportation needs    Medical: Not on file    Non-medical: Not on file  Tobacco Use  . Smoking status: Former Smoker    Packs/day: 1.00    Years: 49.00    Pack years: 49.00    Types: Cigarettes    Start date: 73    Quit date: 02/08/2000    Years since quitting: 18.8  . Smokeless tobacco: Never Used  Substance and Sexual Activity  . Alcohol use: No  . Drug use: No  . Sexual activity: Not on file  Lifestyle  . Physical activity    Days per week: Not on file    Minutes per session: Not on file  . Stress: Not on file  Relationships  . Social connections    Talks on phone: Not on file  Gets together: Not on file    Attends religious service: Not on file    Active member of club or organization: Not on file    Attends meetings of clubs or organizations: Not on file    Relationship status: Not on file  Other Topics Concern  . Not on file  Social History Narrative  . Not on file   Family History  Problem Relation Age of Onset  . Alzheimer's disease Mother   . Dementia Mother 9  . Cancer Father        lung, metastatic to brain   No Known Allergies Prior to Admission medications   Medication Sig Start Date End Date Taking? Authorizing Provider  allopurinol (ZYLOPRIM) 100 MG tablet Take 1 tablet (100 mg total) by mouth daily. 10/16/16  Yes Croitoru, Mihai, MD  amLODipine (NORVASC) 10 MG tablet Take 1 tablet (10 mg total) by mouth daily. 02/25/18  Yes Croitoru, Mihai, MD  aspirin EC 81 MG tablet Take 81 mg by mouth daily.   Yes [provider]  carvedilol (COREG) 3.125 MG tablet Take 1 tablet (3.125 mg  total) by mouth 2 (two) times daily. 11/04/15  Yes Croitoru, Mihai, MD  Cholecalciferol (VITAMIN D3) 1000 UNITS CAPS Take 1,000 Units by mouth daily.   Yes [provider]  gabapentin (NEURONTIN) 300 MG capsule Take 300 mg by mouth 3 (three) times daily. 03/18/16  Yes [provider]  isosorbide mononitrate (IMDUR) 60 MG 24 hr tablet TAKE 1 TABLET BY MOUTH EVERY DAY Patient taking differently: Take 60 mg by mouth daily.  07/22/18  Yes Croitoru, Mihai, MD  metFORMIN (GLUCOPHAGE) 1000 MG tablet Take 1,000 mg by mouth 2 (two) times daily.  10/04/15  Yes [provider]  nitroGLYCERIN (NITROSTAT) 0.4 MG SL tablet Place 1 tablet (0.4 mg total) under the tongue every 5 (five) minutes as needed for chest pain. 03/20/16 12/07/27 Yes Croitoru, Mihai, MD  pravastatin (PRAVACHOL) 40 MG tablet Take 60 mg by mouth daily.  09/29/14  Yes [provider]     Positive ROS: He is status post CABG and has yearly follow-ups with his cardiologist.  All other systems have been reviewed and were otherwise negative with the exception of those mentioned in the HPI and as above.  Physical Exam: Constitutional: Alert, well-appearing, no acute distress elderly gentleman. Ears: External ears without lesions or tenderness.  Left ear canal is clear with a clear TM.  Right ear canal has some debris and a small amount of drainage that was cleaned in the office using suction.  The tube is intact without active drainage.  I applied CSF powder to the right ear canal. Nasal: External nose without lesions. Septum relatively midline.. Clear nasal passages Nasal endoscopy was performed to the right side.  He has some slight fullness in the right nasopharynx but no mucosal lesions noted.  Normal-appearing mucosa with no ulcerations or definitive masses on nasal endoscopy. Oral: Lips and gums without lesions. Tongue and palate mucosa without lesions. Posterior oropharynx clear. Neck: No palpable adenopathy  or masses Respiratory: Breathing comfortably  Skin: No facial/neck lesions or rash noted.  Nasal/sinus endoscopy  Date/Time: 12/10/2018 5:01 PM Performed by: Rozetta Nunnery, MD Authorized by: Rozetta Nunnery, MD   Consent:    Consent obtained:  Verbal   Consent given by:  Patient   Risks discussed:  Pain Procedure details:    Indications: assessment of airway     Medication:  Afrin   Instrument: flexible fiberoptic  nasal endoscope     Scope location: right nare   Nasal cavity:    Right inferior turbinates: normal   Septum:    normal   Sinus:    Right middle meatus: normal     Right nasopharynx: normal (Slight fullness but no mucosal abnormalities noted)     Left nasopharynx: normal     Right Eustachian tube orifices: normal     Left Eustachian tube orifices: normal   Comments:     On nasal endoscopy patient has some fullness in the right nasopharynx but no mucosal lesions noted.  I reviewed the recent CT scan with the family members in the office today.  Assessment: Nasopharyngeal mass probable tumor  Plan: I will plan on reviewing the CT scan with radiology and subsequently planned biopsy depending on options.  This will probably require general anesthesia. Patient's family will call next week concerning scheduling biopsy. Also placed him on either Floxin or Cortisporin otic drops in the right ear to see if this helps at all with the pain. Patient will need cardiac clearance prior to any surgical procedure.  Radene Journey, MD

## 2018-12-13 ENCOUNTER — Telehealth: Payer: Self-pay | Admitting: Cardiovascular Disease

## 2018-12-13 ENCOUNTER — Ambulatory Visit (INDEPENDENT_AMBULATORY_CARE_PROVIDER_SITE_OTHER): Payer: Medicare Other | Admitting: Otolaryngology

## 2018-12-13 NOTE — Telephone Encounter (Signed)
   Called patient with no answer and no option for voice messaging on 12/13/2018 regarding his upcoming nasal endoscopic procedure with biopsy.  We will attempt at a later time.  Needs procedure for nasopharyngeal mass found recently on CT scan. Patient was having pain in the right ear and he underwent a CT scan that showed a right nasopharyngeal mass that had eroded into surrounding bone.   He will require approximately 30 minutes of general anesthesia for the procedure.  Has a past history of CAD with MI in 2003 with subsequent CABG and preserved LVEF, hyperlipidemia, hypertension and borderline DM2.  Was last seen by Dr. Sallyanne Kuster 02/11/2018 and was doing well from a cardiac standpoint.

## 2018-12-13 NOTE — Telephone Encounter (Signed)
° °  [facility] Pre-operative Risk Assessment    Request for surgical clearance:  1. What type of surgery is being performed?  Nasal Endoscopy with Biopsy of Nasopharynx   2. When is this surgery scheduled?  Wants this done asap- no date- wants it next week- would like back today if possible  3. What type of clearance is required (medical clearance vs. Pharmacy clearance to hold med vs. Both)? Medical  4. Are there any medications that need to be held prior to surgery and how long  5. Practice name and name of physician performing surgery? Dr Radene Journey   6. What is your office phone number [phone]    7.   What is your office fax numbe  [phone]  8.   Anesthesia type (None, local, MAC, general) ?  General for 30 minutes   Richard [name] 12/13/2018, 1:22 PM  _________________________________________________________________   (provider comments below)

## 2018-12-16 NOTE — Telephone Encounter (Signed)
I called the patient's daughter and left a message for her to call us back.  Kerin Ransom PA-C 12/16/2018 1:45 PM

## 2018-12-16 NOTE — Telephone Encounter (Signed)
I spoke with the patient's daughter. The patient has had no issues from a cardiac standpoint since he saw [place] last in the office. I will fax clearance to Dr Lucia Gaskins.

## 2018-12-16 NOTE — Telephone Encounter (Signed)
   Primary Cardiologist: Sanda Klein, MD  Chart reviewed and patient's daughter interviewed today over the phone as part of pre-operative protocol coverage. Given past medical history and time since last visit, based on ACC/AHA guidelines, Richard [name] would be at acceptable risk for the planned procedure without further cardiovascular testing.   OK to hold aspirin 5 - 7 days pre op if needed.   I will route this recommendation to the requesting party via Epic fax function and remove from pre-op pool.  Please call with questions.  Kerin Ransom, PA-C 12/16/2018, 2:34 PM

## 2018-12-16 NOTE — Telephone Encounter (Signed)
Follow up    Dr. Lucia Gaskins calling to check on the states of the referral. I did advise that we attempted to call the patient with no success. He states that the patient is in a managed care facility and we would probably be better off speaking with his daughter. Her name is Tressia Miners and the contact number is [phone]

## 2018-12-17 ENCOUNTER — Ambulatory Visit (HOSPITAL_COMMUNITY)
Admission: RE | Admit: 2018-12-17 | Discharge: 2018-12-17 | Disposition: A | Discharge status: Home / Self Care | Payer: Medicare Other | Source: Ambulatory Visit | Attending: Otolaryngology | Admitting: Otolaryngology

## 2018-12-17 ENCOUNTER — Other Ambulatory Visit: Payer: Self-pay

## 2018-12-17 ENCOUNTER — Other Ambulatory Visit (INDEPENDENT_AMBULATORY_CARE_PROVIDER_SITE_OTHER): Payer: Self-pay

## 2018-12-17 DIAGNOSIS — D3705 Neoplasm of uncertain behavior of pharynx: Secondary | ICD-10-CM

## 2018-12-17 DIAGNOSIS — R972 Elevated prostate specific antigen [PSA]: Secondary | ICD-10-CM | POA: Diagnosis not present

## 2018-12-17 NOTE — Progress Notes (Signed)
Spoke with pt spouse and pt daughters about father penile implant. Pt has no record of make and model. Spoke with Dr Lucia Gaskins about this. He informed me had to have this scan for the pt's BX. I informed him we would work on getting the pt's operative report from 2000 which we did get shortly after this conversation. After reviewing the operative report it was only stated that 2 Metal 22cm prosthesis were placed and has no menton of make or model. I then called the neuroradiologist to see if this pt could be cleared by any other way.  The rad stated the pt could not have the MRI withuot any further info and did look at the images and agreed it did appear to he highy metallic on the CT. The pt had a MRI in 2014 which he did not tell the techs about his prosthetic at that time. Radiologist did see that and agree he could not clear the pt. I then called Dr Jennell Corner office back 3 times placed on hold over the course of 35 mins each time only to call back a final time and they had left for the afternoon. Pt MRI will be canceled due to he is unable to have an MRI. Even with the operative report there is no way to clear the pt. PER RADIOLOGIST. Pt daughter was called and informed of all this. She was very understanding and thanked me for helping her father.

## 2018-12-18 ENCOUNTER — Encounter (HOSPITAL_COMMUNITY): Payer: Self-pay | Admitting: Physician Assistant

## 2018-12-18 NOTE — Anesthesia Preprocedure Evaluation (Deleted)
Anesthesia Evaluation    Airway        Dental   Pulmonary former smoker,           Cardiovascular hypertension,      Neuro/Psych    GI/Hepatic   Endo/Other  diabetes  Renal/GU      Musculoskeletal   Abdominal   Peds  Hematology   Anesthesia Other Findings   Reproductive/Obstetrics                             Anesthesia Physical Anesthesia Plan  ASA:   Anesthesia Plan:    Post-op Pain Management:    Induction:   PONV Risk Score and Plan:   Airway Management Planned:   Additional Equipment:   Intra-op Plan:   Post-operative Plan:   Informed Consent:   Plan Discussed with:   Anesthesia Plan Comments: (Follows with cardiology for hx of CAD s/p CABG 2003. Last seen by Dr. Sallyanne Kuster 02/11/18. Per note, pt doing well from CV standpoint, no anginal symptoms, excellent lipid control. BP noted to be relatively low and spironolactone was stopped. Cleared for surgery per telephone encounter 12/16/18 "Chart reviewed and patient's daughter interviewed today over the phone as part of pre-operative protocol coverage. Given past medical history and time since last visit, based on ACC/AHA guidelines, Richard [name] would be at acceptable risk for the planned procedure without further cardiovascular testing. OK to hold aspirin 5 - 7 days pre op if needed."  Will need DOS labs and eval.   EKG 02/11/18: Sinus rhythm with PACs. Rate 86. Nonspecific intraventricular conduction delay.   Nuclear stress 03/28/16: Nuclear stress EF: 52%. The left ventricular ejection fraction is mildly decreased (45-54%). This is a low risk study. The study is normal.   TTE 2011: Normal left ventricular systolic function. Postoperative paradoxical septal motion. Mild diastolic dysfunction. Dilated left atrium. Degenerative changes of the mitral and aortic valves, not hemodynamically significant. Mild mitral  insufficiency. Mild aortic insufficiency. No pericardial effusion.)        Anesthesia Quick Evaluation

## 2018-12-18 NOTE — Progress Notes (Signed)
Anesthesia Chart Review: Same Day workup  Follows with cardiology for hx of CAD s/p CABG 2003. Last seen by Dr. Sallyanne Kuster 02/11/18. Per note, pt doing well from CV standpoint, no anginal symptoms, excellent lipid control. BP noted to be relatively low and spironolactone was stopped. Cleared for surgery per telephone encounter 12/16/18 "Chart reviewed and patient's daughter interviewed today over the phone as part of pre-operative protocol coverage. Given past medical history and time since last visit, based on ACC/AHA guidelines, RAIYAN DORRY would be at acceptable risk for the planned procedure without further cardiovascular testing. OK to hold aspirin 5 - 7 days pre op if needed. "  Will need DOS labs and eval.   EKG 02/11/18: Sinus rhythm with PACs. Rate 86. Nonspecific intraventricular conduction delay.   Nuclear stress 03/28/16:  Nuclear stress EF: 52%. The left ventricular ejection fraction is mildly decreased (45-54%).  This is a low risk study.  The study is normal.   TTE 2011: Normal left ventricular systolic function. Postoperative paradoxical septal motion. Mild diastolic dysfunction. Dilated left atrium. Degenerative changes of the mitral and aortic valves, not hemodynamically significant. Mild mitral insufficiency. Mild aortic insufficiency. No pericardial effusion.   [place], [place]/Anesthesiology Phone [phone] 12/18/2018 12:51 PM

## 2018-12-20 ENCOUNTER — Encounter (HOSPITAL_COMMUNITY): Admission: RE | Payer: Self-pay | Source: Home / Self Care

## 2018-12-20 ENCOUNTER — Ambulatory Visit (HOSPITAL_COMMUNITY): Admission: RE | Admit: 2018-12-20 | Payer: Medicare Other | Source: Home / Self Care | Admitting: Otolaryngology

## 2018-12-20 SURGERY — ENDOSCOPY, NOSE
Anesthesia: General

## 2019-01-02 DIAGNOSIS — R3121 Asymptomatic microscopic hematuria: Secondary | ICD-10-CM | POA: Diagnosis not present

## 2019-01-02 DIAGNOSIS — R3129 Other microscopic hematuria: Secondary | ICD-10-CM | POA: Diagnosis not present

## 2019-01-02 DIAGNOSIS — R634 Abnormal weight loss: Secondary | ICD-10-CM | POA: Diagnosis not present

## 2019-01-06 ENCOUNTER — Other Ambulatory Visit: Payer: Self-pay

## 2019-01-06 MED ORDER — ISOSORBIDE MONONITRATE ER 60 MG PO TB24: ORAL_TABLET | ORAL | 0 refills | Status: AC

## 2019-01-16 DIAGNOSIS — R972 Elevated prostate specific antigen [PSA]: Secondary | ICD-10-CM | POA: Diagnosis not present

## 2019-01-16 DIAGNOSIS — N400 Enlarged prostate without lower urinary tract symptoms: Secondary | ICD-10-CM | POA: Diagnosis not present

## 2019-02-11 ENCOUNTER — Telehealth (INDEPENDENT_AMBULATORY_CARE_PROVIDER_SITE_OTHER): Payer: Self-pay | Admitting: Otolaryngology

## 2019-02-11 NOTE — Telephone Encounter (Signed)
I had previously called the daughter yesterday about referral to [facility] to have the biopsy of the nasopharyngeal tumor and had talked to Dr. Conley Canal concerning this.  The daughter called back this morning informing us that the patient will be placed in hospice with no further intervention.

## 2019-03-03 ENCOUNTER — Other Ambulatory Visit: Payer: Self-pay

## 2019-03-03 MED ORDER — AMLODIPINE BESYLATE 10 MG PO TABS: 10.0000 mg | ORAL_TABLET | Freq: Every day | ORAL | 0 refills | Status: AC

## 2019-03-07 ENCOUNTER — Ambulatory Visit: Payer: Medicare Other | Admitting: Cardiovascular Disease

## 2019-03-10 ENCOUNTER — Ambulatory Visit (INDEPENDENT_AMBULATORY_CARE_PROVIDER_SITE_OTHER): Payer: Medicare Other | Admitting: Otolaryngology

## 2019-03-11 ENCOUNTER — Telehealth: Payer: Medicare Other | Admitting: Cardiology

## 2019-04-04 ENCOUNTER — Ambulatory Visit: Payer: Medicare Other | Admitting: Cardiovascular Disease

## 2019-05-10 DEATH — deceased

## 2021-10-11 IMAGING — CT CT NECK W/ CM
4 of 6 series · 12 of 33 positions shown, 14 images · IV contrast (APPLIED)
Comparison: None.

CLINICAL DATA: Sore throat with right ear pain and headache started
6 weeks ago

EXAM:
CT NECK WITH CONTRAST
TECHNIQUE: Multidetector CT imaging of the neck was performed using the
standard protocol following the bolus administration of intravenous
contrast.
CONTRAST:  75mL OMNIPAQUE IOHEXOL 300 MG/ML  SOLN

[Series 5: neck lungs · axial · 0.54mm/px · z∈[-208,-128]mm · 2 of 122 slices shown]
[im 41/122  bone]
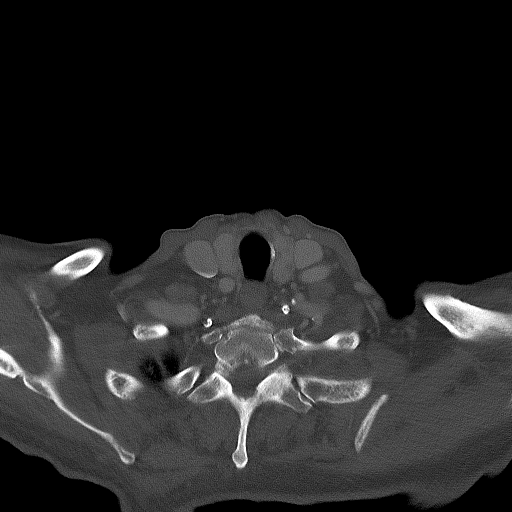
[im 81/122  bone]
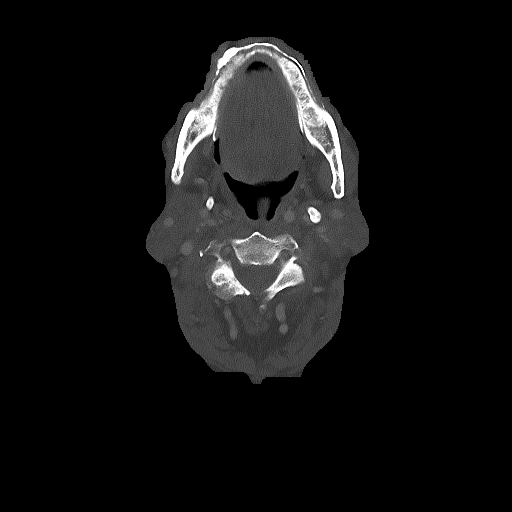

[Series 6: axial bone · axial · 0.54mm/px · z∈[-208,-126]mm · 2 of 123 slices shown, 3 images]
[im 41/123  soft-tissue]
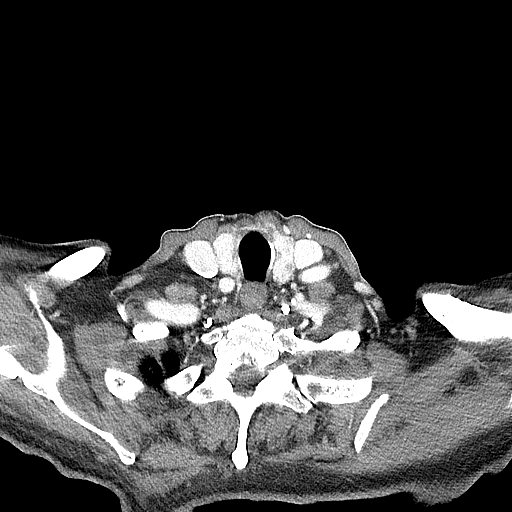
[im 41/123  bone]
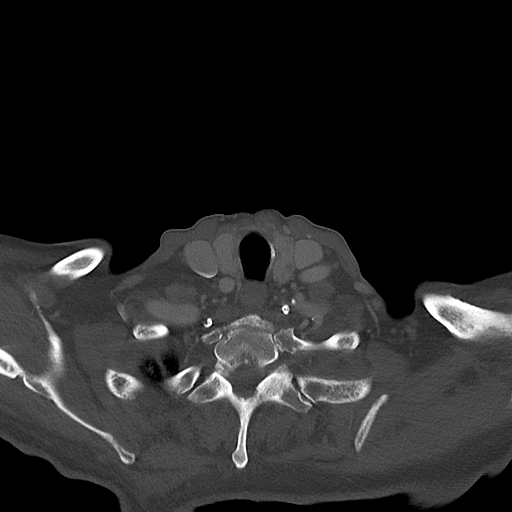
[im 82/123  bone]
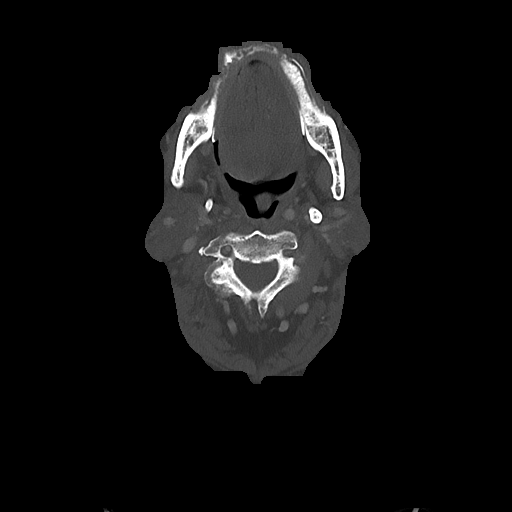

[Series 7: sag neck · sagittal · 0.49mm/px · 5 of 100 slices shown, 6 images]
[im 34/100  bone]
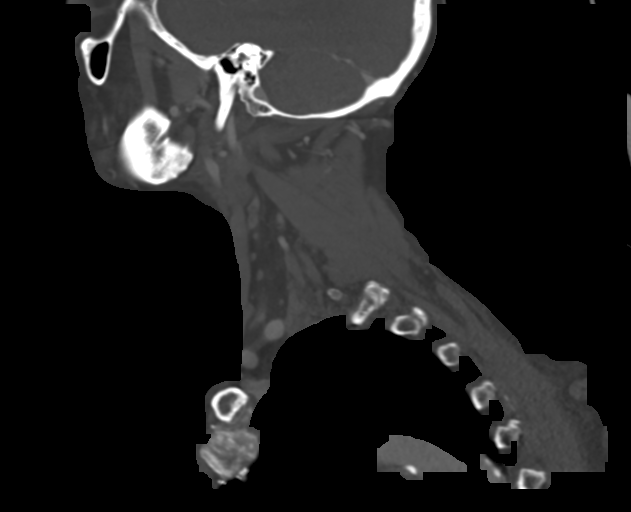
[im 42/100  bone]
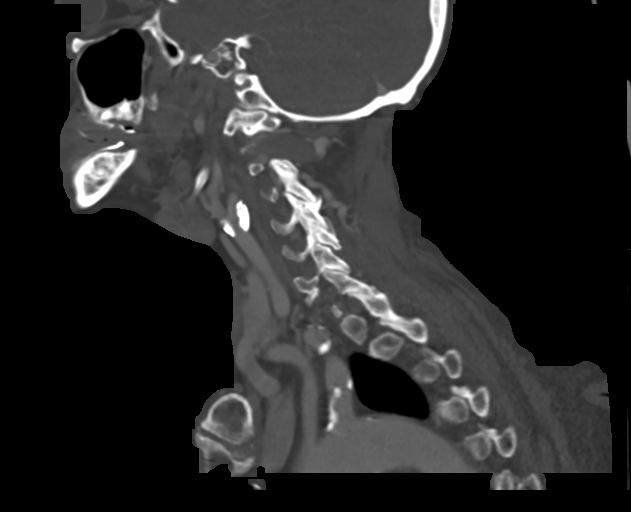
[im 50/100  soft-tissue]
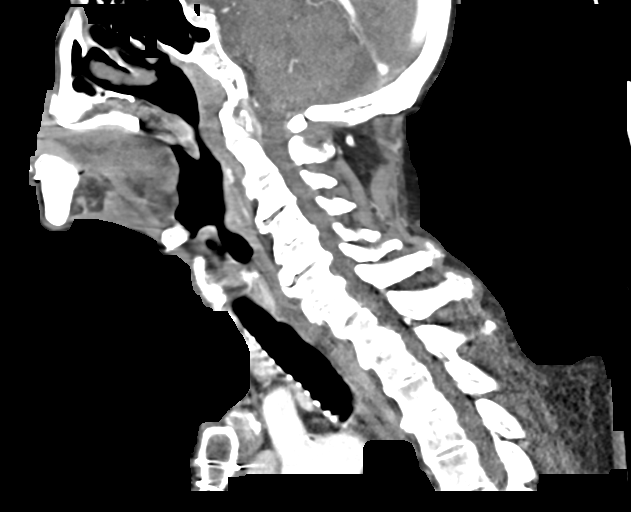
[im 50/100  bone]
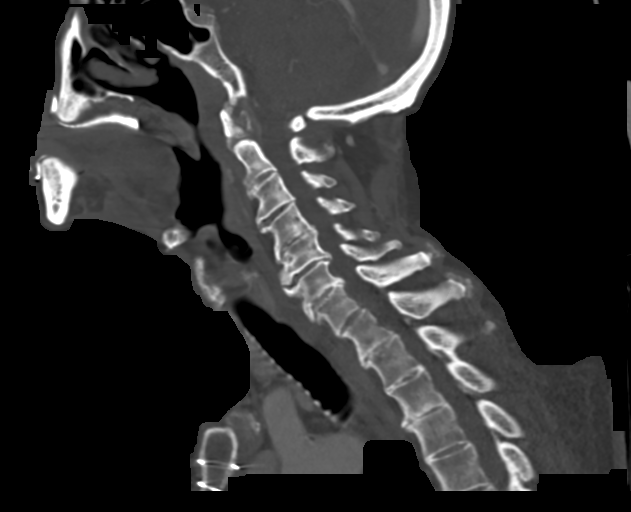
[im 58/100  bone]
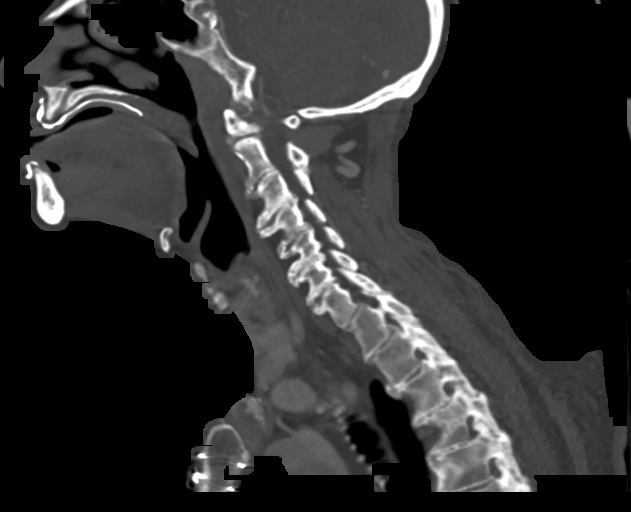
[im 67/100  bone]
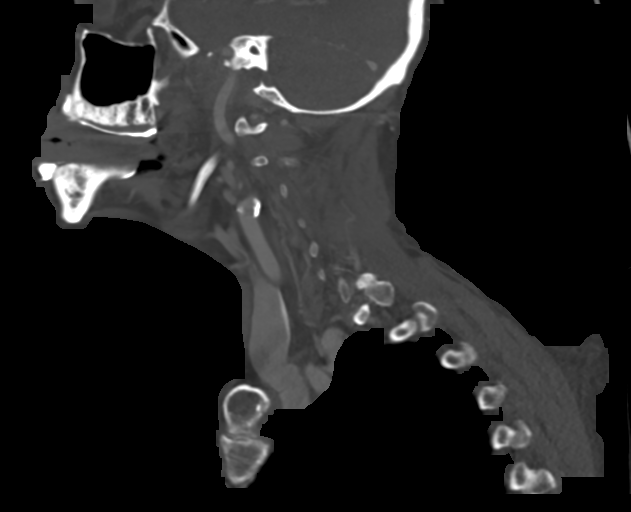

[Series 8: cor neck · coronal · 0.49mm/px · 3 of 153 slices shown]
[im 31/153  bone]
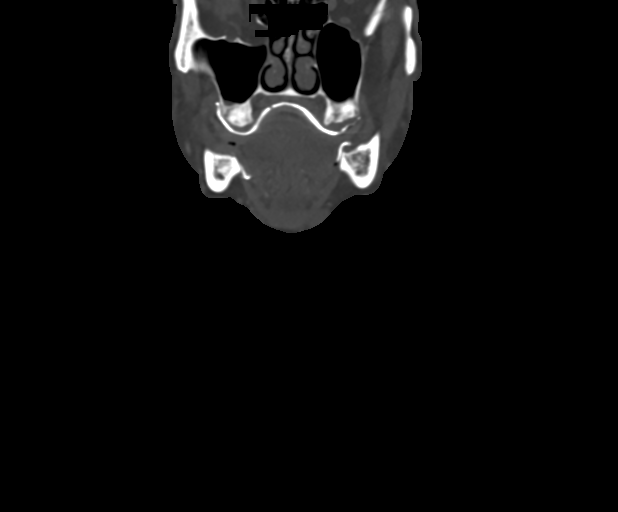
[im 61/153  bone]
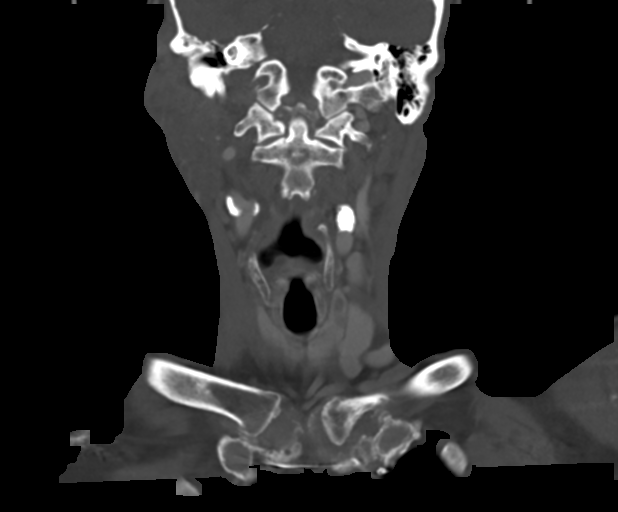
[im 92/153  bone]
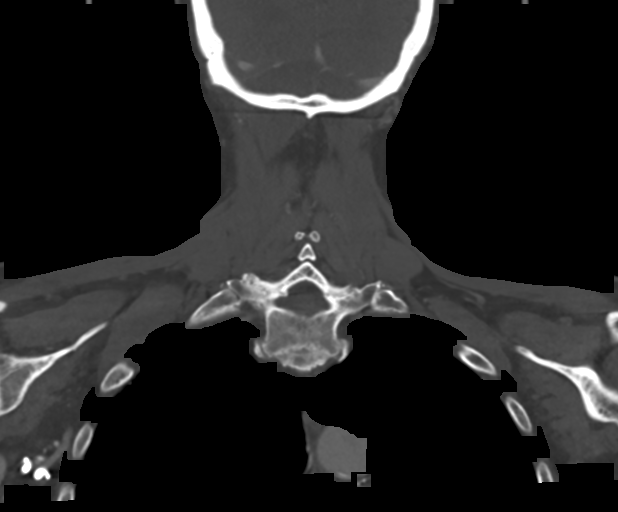

[12 of 33 positions shown; findings below may reference images not displayed]

FINDINGS: Pharynx and larynx: Masslike thickening in the midline and right
nasopharynx which extends to the level of the carotid sheath. No
enhancing IJ is seen on the right, with non enhancement of the right
transverse and sigmoid sinuses. There is mild cortical irregularity
involving the inferior surface of the clivus. Small defect is also
seen at the floor of the right sphenoid sinus.

Salivary glands: Atrophic submandibular glands.

Thyroid: Subcentimeter nodules in the left more than right thyroid,
incidental.

Lymph nodes: None enlarged or abnormal density.

Vascular: Right IJ findings as noted above.

Limited intracranial: No evidence of intracranial spread. Fetal type
bilateral PCA. The vertebral and basilar arteries are very small and
there have been remote bilateral cerebellar infarcts.

Visualized orbits: Bilateral cataract resection. Remote blowout
fracture of the right orbital floor.

Mastoids and visualized paranasal sinuses: Right mastoid
opacification attributed to eustachian tube dysfunction.

Skeleton: As above. No evidence of hematogenous metastasis. There is
extensive cervical and upper thoracic spine degeneration with
multilevel bridging ossification of the facets.

Upper chest: Negative
IMPRESSION: 1. Findings of right-sided nasopharyngeal carcinoma with mass
extending to the right upper carotid sheath and likely causing early
erosion of the clivus cortex. An erosion through the floor of the
right sphenoid sinus could also be tumoral. Negative for adenopathy.
Neck MRI would likely be contributory.
2. Non-opacified upper right internal jugular vein and right
transverse/sigmoid dural venous sinuses, presumably related to #1.
3. Right mastoid opacification attributed to eustachian tube
dysfunction.
